# Patient Record
Sex: Female | Born: 1965 | Race: Black or African American | Hispanic: No | Marital: Married | Smoking: Current every day smoker
Health system: Southern US, Community
[De-identification: ages and names within clinical notes are randomized; demographics above are authoritative.]

## PROBLEM LIST (undated history)

## (undated) DIAGNOSIS — E119 Type 2 diabetes mellitus without complications: Secondary | ICD-10-CM

## (undated) DIAGNOSIS — E079 Disorder of thyroid, unspecified: Secondary | ICD-10-CM

## (undated) DIAGNOSIS — I10 Essential (primary) hypertension: Secondary | ICD-10-CM

## (undated) DIAGNOSIS — Z720 Tobacco use: Secondary | ICD-10-CM

## (undated) HISTORY — PX: TUBAL LIGATION: SHX77

---

## 2013-01-13 ENCOUNTER — Emergency Department: Payer: Self-pay | Admitting: Emergency Medicine

## 2014-10-29 IMAGING — CR DG HAND COMPLETE 3+V*L*
1 series · 3 of 3 positions shown · non-contrast
Comparison: none

REASON FOR EXAM: pan/edema/finger joint deformity
COMMENTS:   May transport without cardiac monitor

[Series 1: pa · 0.17mm/px · 3 of 3 slices shown]
[im 1/3]
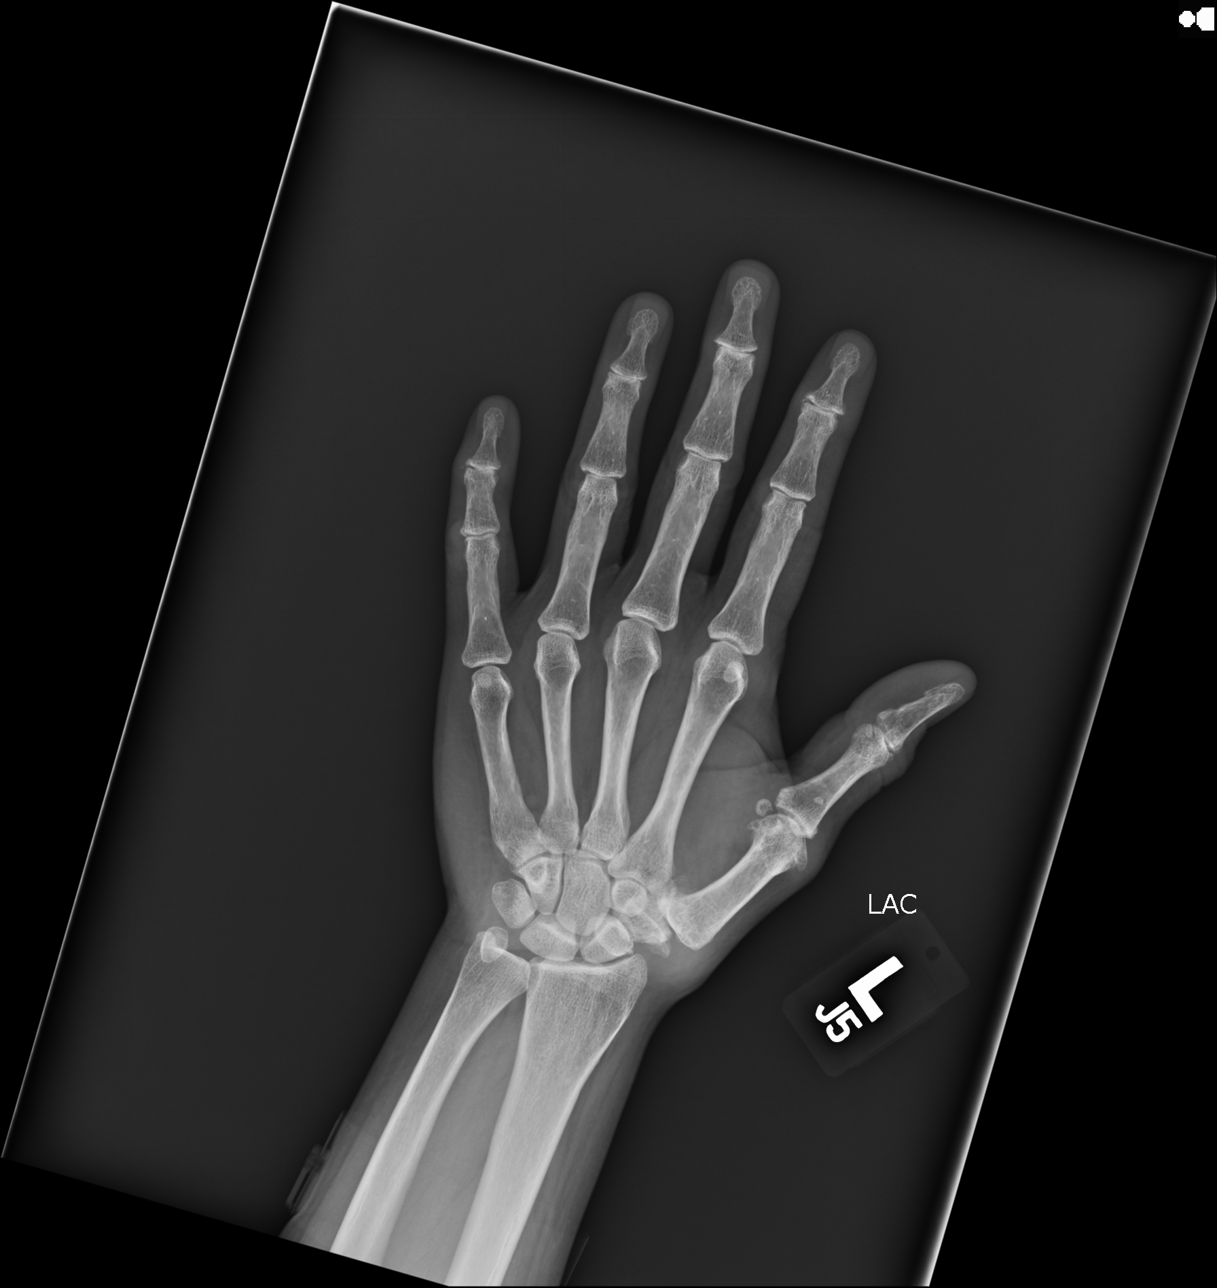
[im 2/3]
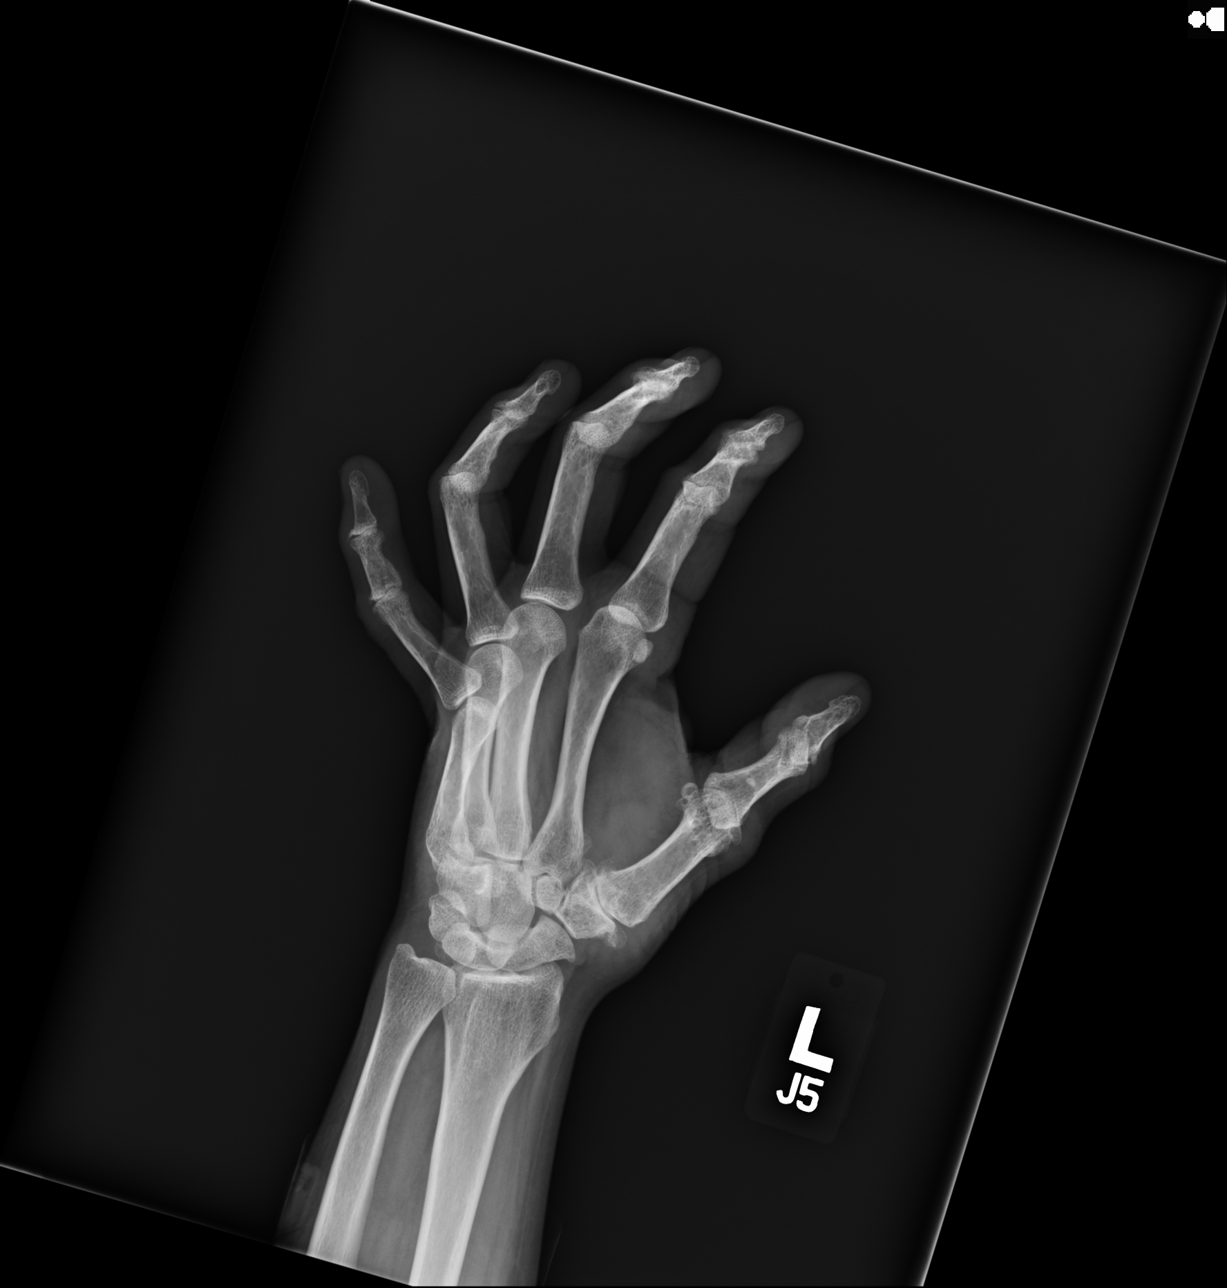
[im 3/3]
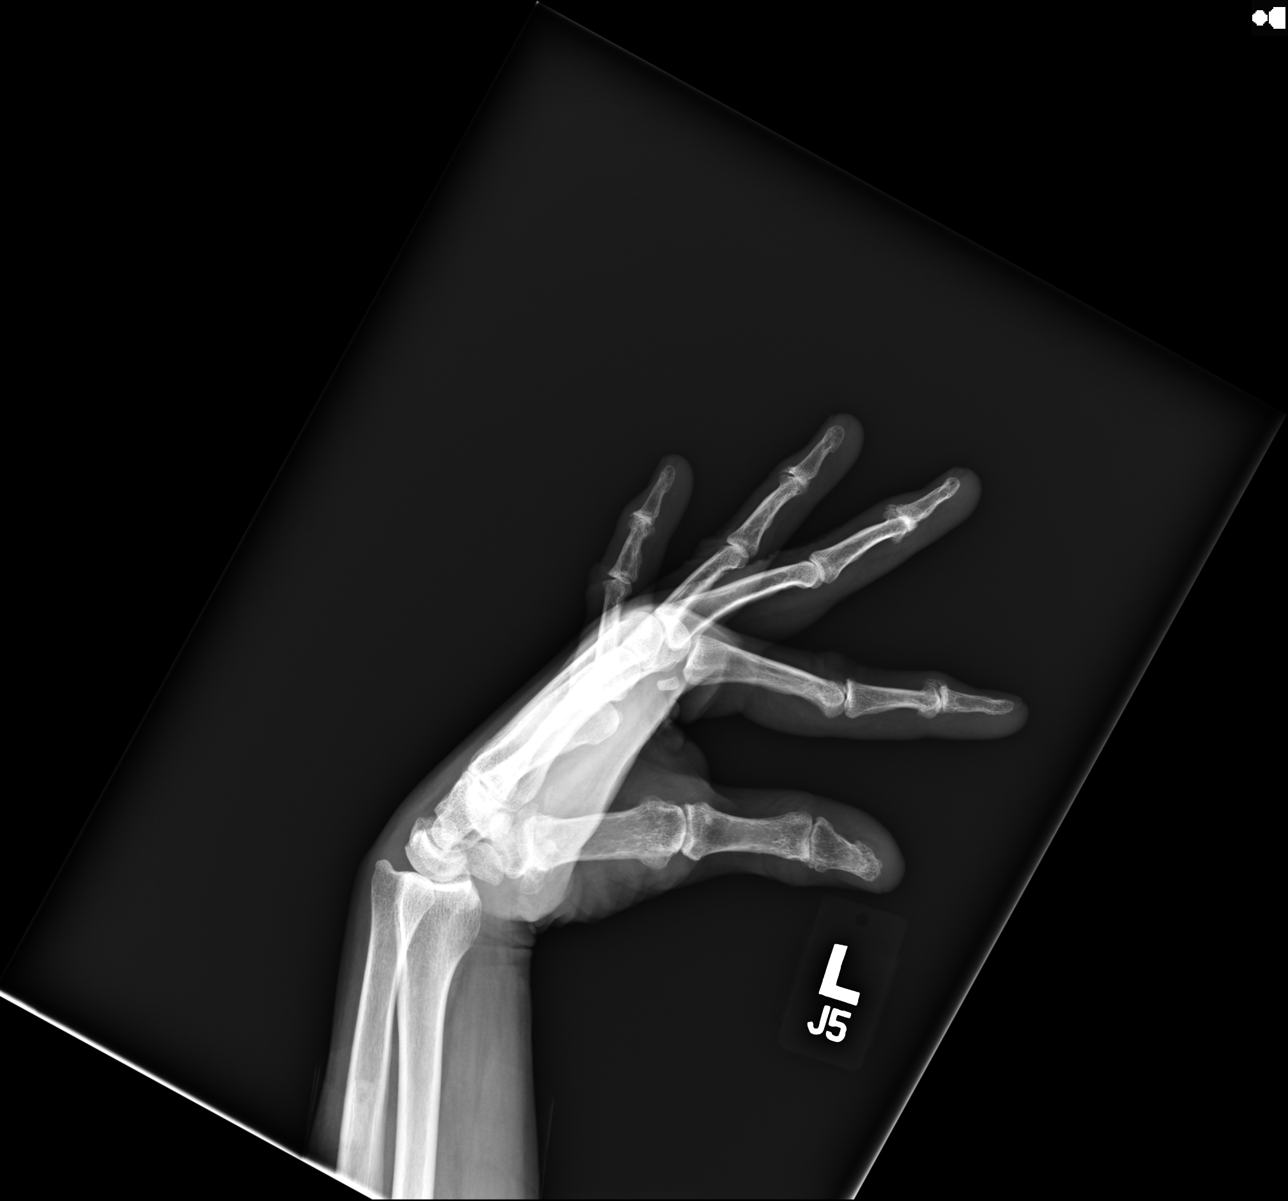

[3 of 3 positions shown; findings below may reference images not displayed]

PROCEDURE:     DXR - DXR HAND LT COMPLETE  W/OBLIQUES  - January 13, 2013 [DATE]

RESULT:     Three views of the left hand reveal narrowing of the
interphalangeal joints and the carpophalangeal joints diffusely. There is no
lytic or blastic lesion nor evidence of a fracture or dislocation. There is
moderate degenerative change of the first carpometacarpal joint. There is
narrowing of the radio carpal joint. No erosive changes are demonstrated. No
abnormal soft tissue calcifications are evident.
IMPRESSION: There is moderate osteoarthritic change diffusely of the
radiocarpal joint, the first carpometacarpal joint, and the
metacarpophalangeal and interphalangeal joints.

[REDACTED]

## 2017-02-07 ENCOUNTER — Emergency Department: Payer: Self-pay

## 2017-02-07 ENCOUNTER — Emergency Department
Admission: EM | Admit: 2017-02-07 | Discharge: 2017-02-07 | Disposition: A | Discharge status: Home / Self Care | Payer: Self-pay | Attending: Emergency Medicine | Admitting: Emergency Medicine

## 2017-02-07 ENCOUNTER — Other Ambulatory Visit: Payer: Self-pay

## 2017-02-07 DIAGNOSIS — F17200 Nicotine dependence, unspecified, uncomplicated: Secondary | ICD-10-CM | POA: Insufficient documentation

## 2017-02-07 DIAGNOSIS — R2243 Localized swelling, mass and lump, lower limb, bilateral: Secondary | ICD-10-CM | POA: Insufficient documentation

## 2017-02-07 DIAGNOSIS — E119 Type 2 diabetes mellitus without complications: Secondary | ICD-10-CM | POA: Insufficient documentation

## 2017-02-07 DIAGNOSIS — R609 Edema, unspecified: Secondary | ICD-10-CM

## 2017-02-07 HISTORY — DX: Disorder of thyroid, unspecified: E07.9

## 2017-02-07 HISTORY — DX: Type 2 diabetes mellitus without complications: E11.9

## 2017-02-07 LAB — TROPONIN I: Troponin I: <0.03 ng/mL (ref <0.03)

## 2017-02-07 LAB — CBC
HCT: 40 % (ref 35.0–47.0)
Hemoglobin: 13.4 g/dL (ref 12.0–16.0)
MCH: 32.9 pg (ref 26.0–34.0)
MCHC: 33.4 g/dL (ref 32.0–36.0)
MCV: 98.5 fL (ref 80.0–100.0)
PLATELETS: 198 10*3/uL (ref 150–440)
RBC: 4.06 MIL/uL (ref 3.80–5.20)
RDW: 13.9 % (ref 11.5–14.5)
WBC: 6.4 10*3/uL (ref 3.6–11.0)

## 2017-02-07 LAB — BASIC METABOLIC PANEL
Anion gap: 9 (ref 5–15)
BUN: 10 mg/dL (ref 6–20)
CHLORIDE: 101 mmol/L (ref 101–111)
CO2: 25 mmol/L (ref 22–32)
CREATININE: 0.65 mg/dL (ref 0.44–1.00)
Calcium: 9 mg/dL (ref 8.9–10.3)
GFR calc Af Amer: >60 mL/min (ref >60)
GFR calc non Af Amer: >60 mL/min (ref >60)
GLUCOSE: 94 mg/dL (ref 65–99)
Potassium: 3.6 mmol/L (ref 3.5–5.1)
Sodium: 135 mmol/L (ref 135–145)

## 2017-02-07 MED ORDER — OXYCODONE-ACETAMINOPHEN 5-325 MG PO TABS
2.0000 | ORAL_TABLET | Freq: Once | ORAL | Status: AC
  Administered 2017-02-07: 2 via ORAL
  Filled 2017-02-07: qty 2

## 2017-02-07 MED ORDER — FUROSEMIDE 20 MG PO TABS: 20.0000 mg | ORAL_TABLET | Freq: Every day | ORAL | 0 refills | Status: DC

## 2017-02-07 NOTE — ED Triage Notes (Signed)
Pt c/o swelling in BL LE for the past couple of weeks, states in the past week she has not been able to work due to the swelling. Pt also states she has a hx of diabetes on insulin years ago but has not taken medication in years.

## 2017-02-07 NOTE — ED Provider Notes (Signed)
[facility] Emergency Department Provider Note       Time seen: ----------------------------------------- 10:30 AM on 02/07/2017 -----------------------------------------    I have reviewed the triage vital signs and the nursing notes.  HISTORY   Chief Complaint Leg Swelling    HPI Ronny BaconBrenda A Schaben is a 51 y.o. female with a history of diabetes who presents to the ED for swelling in the lower extremities for the past couple weeks.  Patient states in the past week she has not been able to work due to the swelling.  Patient reports she has a history of diabetes but is not taking medication at this time.  Patient noticed swelling in the left leg more than the right. Pain is 10 out of 10 in the left leg.  Past Medical History:  Diagnosis Date  . 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 Thyroid disease    Graves disease    There are no active problems to display for this patient.   Past Surgical History:  Procedure Laterality Date  . TUBAL LIGATION      Allergies Patient has no known allergies.  Social History Social History   Tobacco Use  . Smoking status: Current Every Day Smoker  . Smokeless tobacco: Never Used  Substance Use Topics  . Alcohol use: Yes  . Drug use: No    Review of Systems Constitutional: Negative for fever. Cardiovascular: Negative for chest pain. Respiratory: Negative for shortness of breath. Gastrointestinal: Negative for abdominal pain, vomiting and diarrhea. Genitourinary: Negative for dysuria. Musculoskeletal: Positive for edema, pain particularly in the left leg Skin: Negative for rash. Neurological: Negative for headaches, focal weakness or numbness.  All systems negative/normal/unremarkable except as stated in the HPI  ____________________________________________   PHYSICAL EXAM:  VITAL SIGNS: ED Triage Vitals  Enc Vitals Group     BP 02/07/17 0757 (!) 188/91     Pulse  Rate 02/07/17 0757 96     Resp 02/07/17 0757 17     Temp 02/07/17 0757 98.4 F (36.9 C)     Temp Source 02/07/17 0757 Oral     SpO2 02/07/17 0757 99 %     Weight 02/07/17 0758 135 lb (61.2 kg)     Height 02/07/17 0758 5' (1.524 m)     Head Circumference --      Peak Flow --      Pain Score --      Pain Loc --      Pain Edu? --      Excl. in GC? --     Constitutional: Alert and oriented. Well appearing and in no distress. Eyes: Conjunctivae are normal. Normal extraocular movements. ENT   Head: Normocephalic and atraumatic.   Nose: No congestion/rhinnorhea.   Mouth/Throat: Mucous membranes are moist.   Neck: No stridor. Cardiovascular: Normal rate, regular rhythm. No murmurs, rubs, or gallops.  Good peripheral pulses Respiratory: Normal respiratory effort without tachypnea nor retractions. Breath sounds are clear and equal bilaterally. No wheezes/rales/rhonchi. Gastrointestinal: Soft and nontender. Normal bowel sounds Musculoskeletal: Nontender with normal range of motion in extremities. No lower extremity tenderness nor edema. Neurologic:  Normal speech and language. No gross focal neurologic deficits are appreciated.  Skin:  Skin is warm, dry and intact. No rash noted. Psychiatric: Mood and affect are normal. Speech and behavior are normal.  ____________________________________________  EKG: Interpreted by me.  Sinus rhythm rate 84 bpm, normal PR interval, normal QRS, normal QT.  ____________________________________________  ED  COURSE:  Pertinent labs & imaging results that were available during my care of the patient were reviewed by me and considered in my medical decision making (see chart for details). Patient presents for swelling, we will assess with labs and imaging as indicated.   Procedures ____________________________________________   LABS (pertinent positives/negatives)  Labs Reviewed  BASIC METABOLIC PANEL  CBC  TROPONIN I     RADIOLOGY Images were viewed by me  Sinus rhythm with a rate 84 bpm, normal PR interval, normal QRS, normal QT.  ____________________________________________  DIFFERENTIAL DIAGNOSIS   Peripheral edema, CHF, DVT, albumin deficiency  FINAL ASSESSMENT AND PLAN  Edema  Plan: Patient had presented for peripheral edema and pain secondary to same. Patient's labs were reassuring. Patient's imaging was negative for DVT.  I will advise compression stockings as well as short supply of Lasix.  She is stable for outpatient follow-up.   Emily FilbertWilliams, Jonathan E, MD   Note: This note was generated in part or whole with voice recognition software. Voice recognition is usually quite accurate but there are transcription errors that can and very often do occur. I apologize for any typographical errors that were not detected and corrected.     Emily FilbertWilliams, Jonathan E, MD 02/07/17 1146

## 2017-10-03 ENCOUNTER — Ambulatory Visit: Payer: Self-pay | Attending: Oncology

## 2017-11-15 IMAGING — US US EXTREM LOW VENOUS*L*
1 series · 13 of 24 positions shown · non-contrast
Comparison: None.

CLINICAL DATA: Left lower extremity pain and edema for the past 6
days. History of smoking. Evaluate for DVT.



[Series 1: us extrem low venous*left* · 13 of 35 slices shown]
[im 1/35]
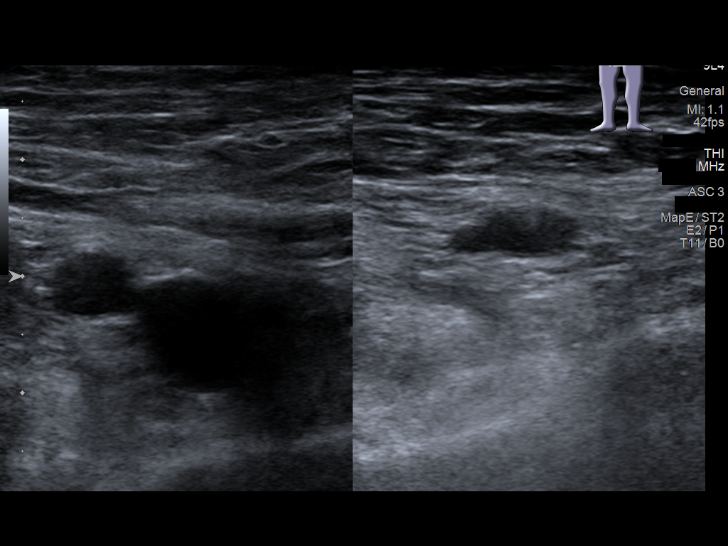
[im 3/35]
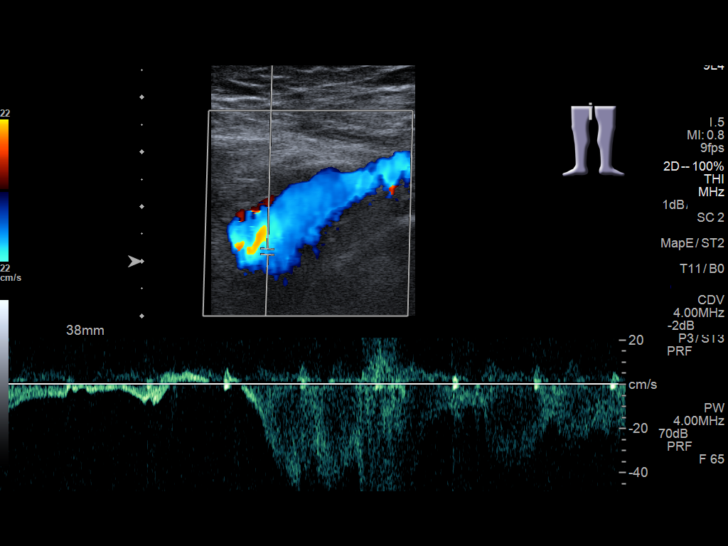
[im 6/35]
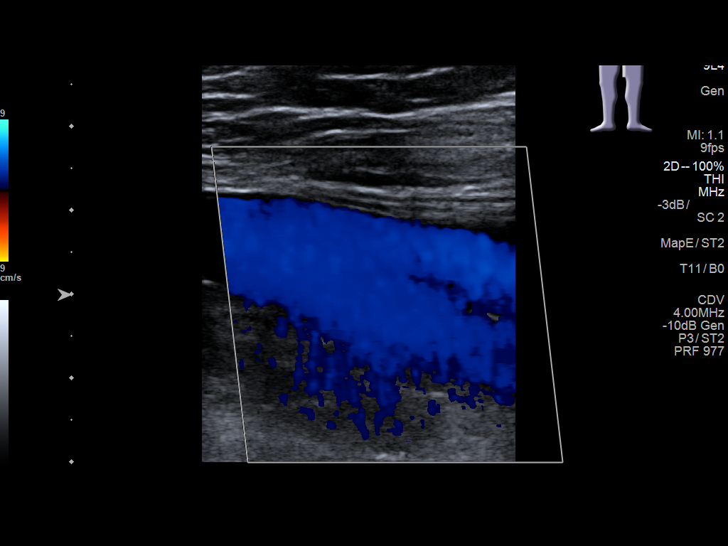
[im 9/35]
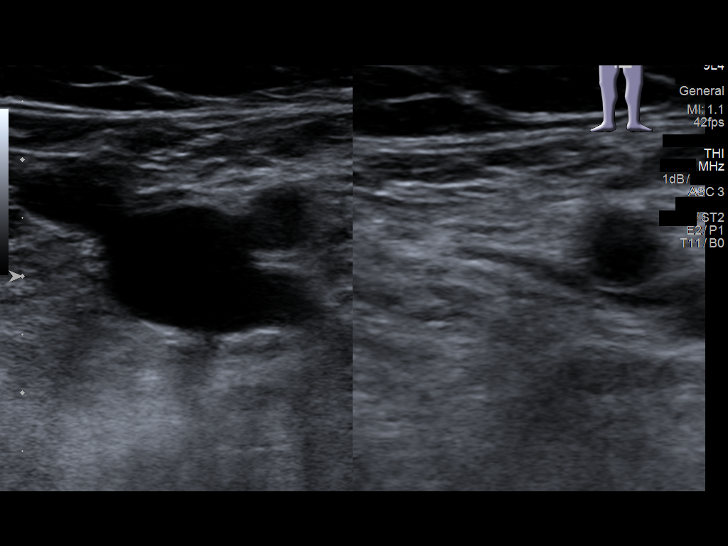
[im 12/35]
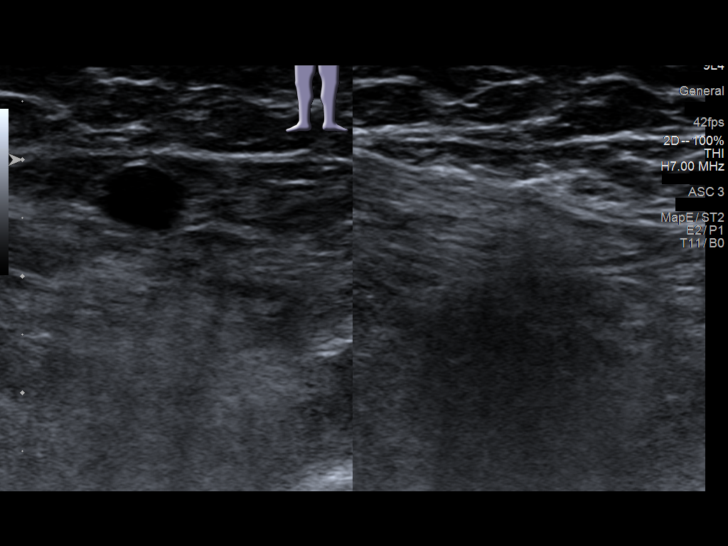
[im 15/35]
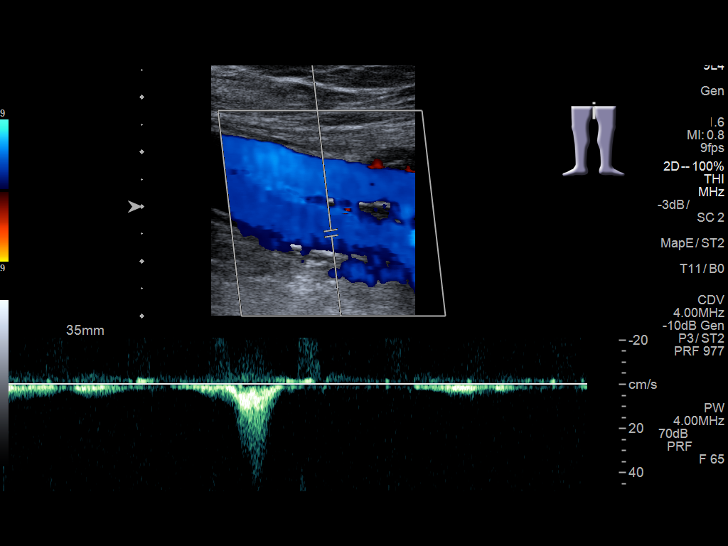
[im 18/35]
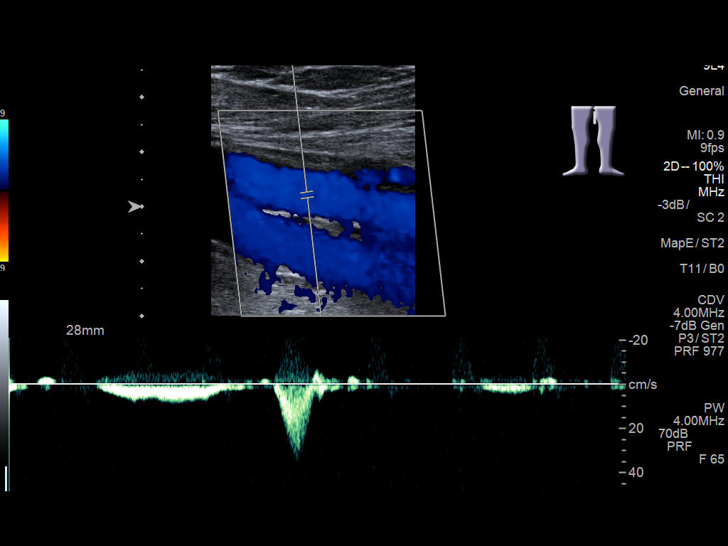
[im 20/35]
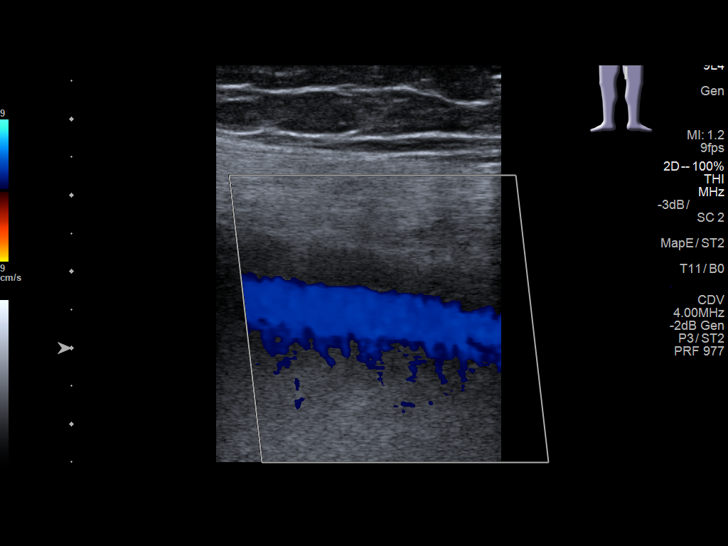
[im 23/35]
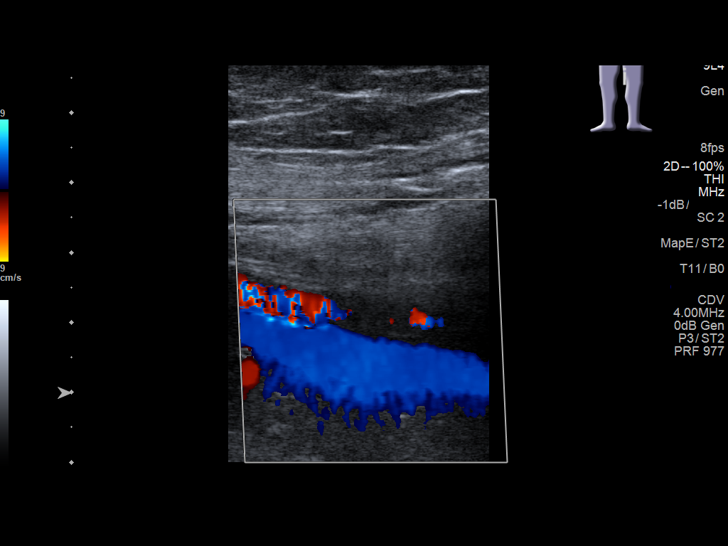
[im 26/35]
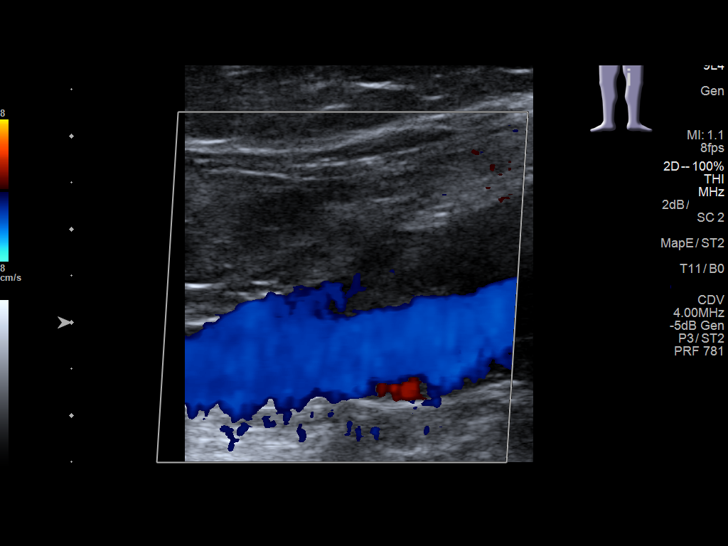
[im 29/35]
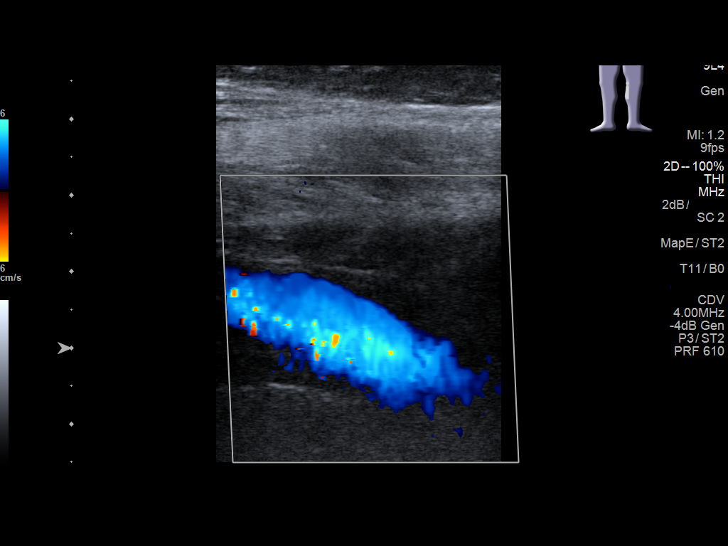
[im 32/35]
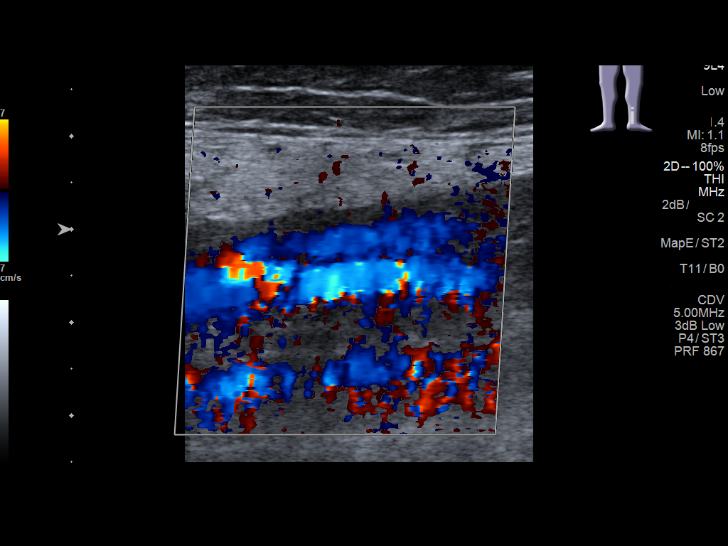
[im 35/35]
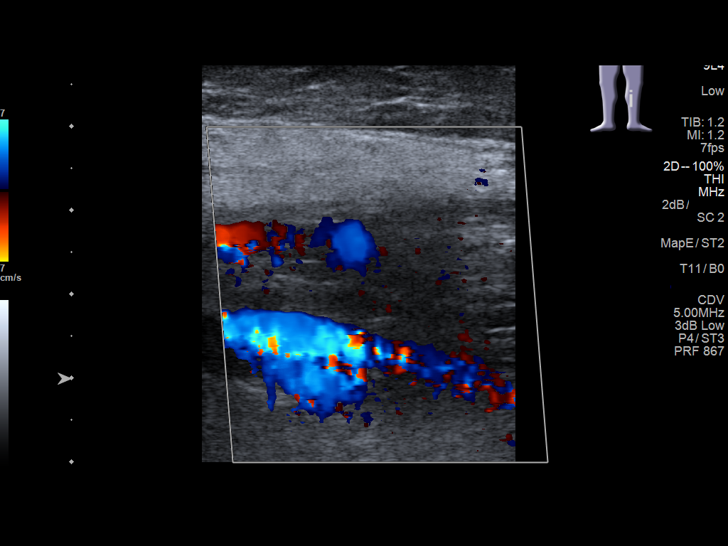

[13 of 24 positions shown; findings below may reference images not displayed]

FINDINGS: Contralateral Common Femoral Vein: Respiratory phasicity is normal
and symmetric with the symptomatic side. No evidence of thrombus.
Normal compressibility.

Common Femoral Vein: No evidence of thrombus. Normal
compressibility, respiratory phasicity and response to augmentation.

Saphenofemoral Junction: No evidence of thrombus. Normal
compressibility and flow on color Doppler imaging.

Profunda Femoral Vein: No evidence of thrombus. Normal
compressibility and flow on color Doppler imaging.

Femoral Vein: No evidence of thrombus. Normal compressibility,
respiratory phasicity and response to augmentation.

Popliteal Vein: No evidence of thrombus. Normal compressibility,
respiratory phasicity and response to augmentation.

Calf Veins: No evidence of thrombus. Normal compressibility and flow
on color Doppler imaging.

Superficial Great Saphenous Vein: No evidence of thrombus. Normal
compressibility.

Venous Reflux:  None.

Other Findings:  None.
IMPRESSION: No evidence DVT within the left lower extremity.

## 2018-11-23 IMAGING — CR DG CHEST 2V
2 series · 2 of 2 positions shown · non-contrast
Comparison: None.

CLINICAL DATA: Bilateral lower extremity swelling with shortness of
Breath

EXAM:
CHEST  2 VIEW

[chest lat]
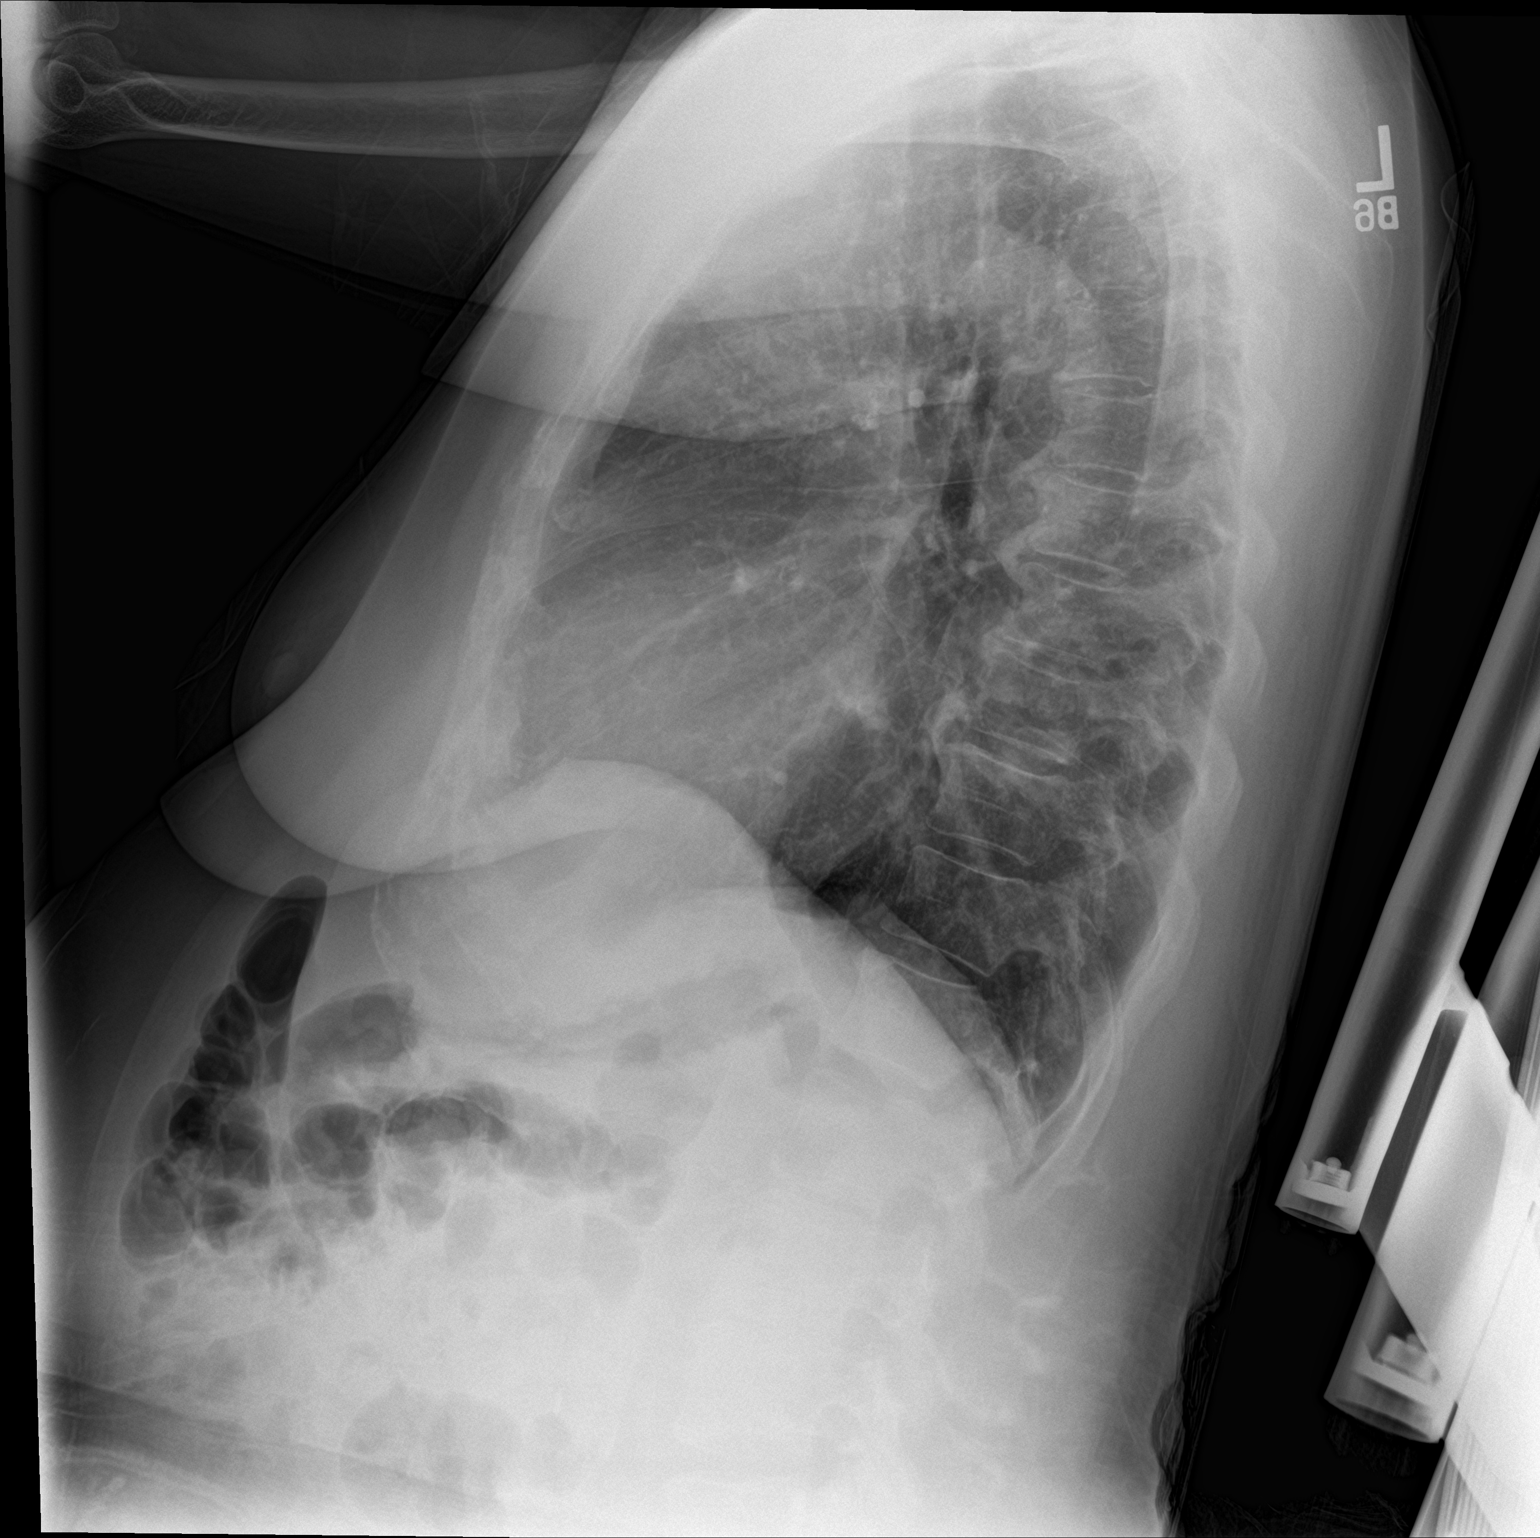

[chest ap]
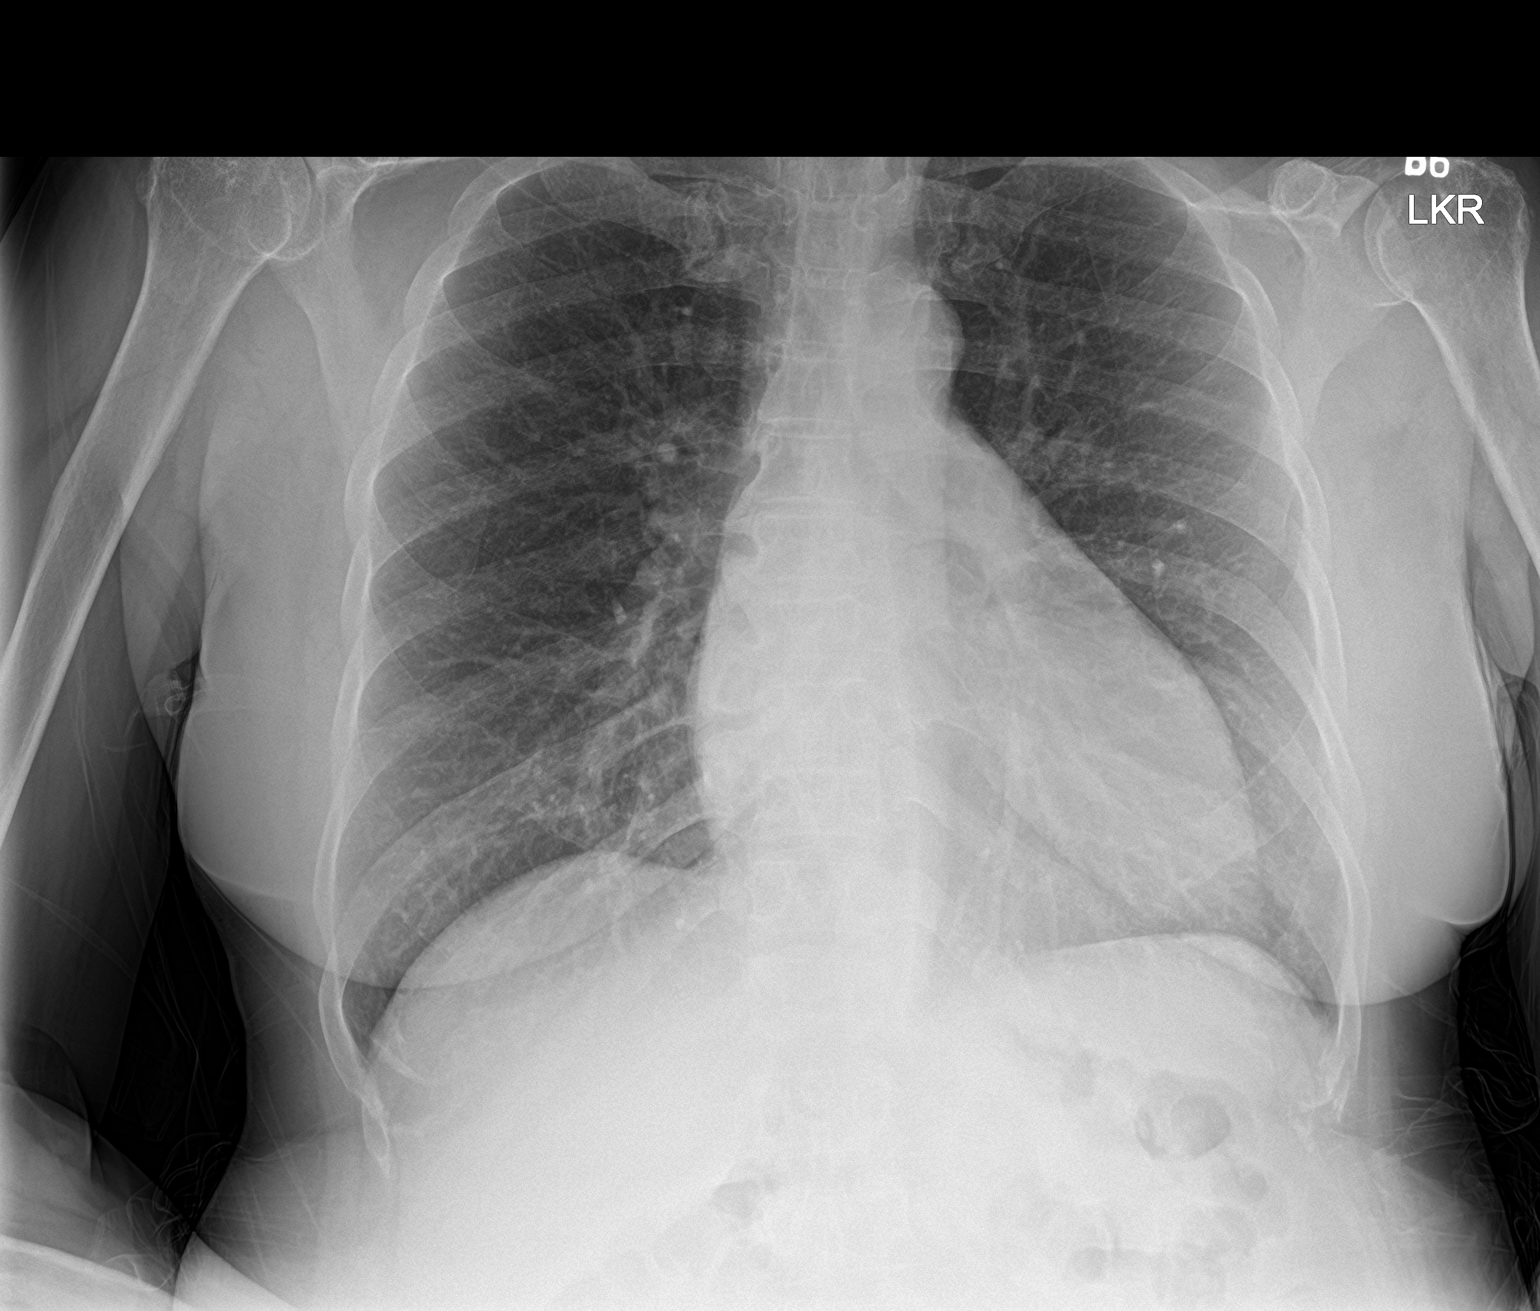

[2 of 2 positions shown; findings below may reference images not displayed]

FINDINGS: Cardiac shadow is at the upper limits of normal in size. The lungs
are well aerated bilaterally. No focal infiltrate or sizable
effusion is seen. Degenerative change of the thoracic spine is
noted.
IMPRESSION: No active cardiopulmonary disease.

## 2019-02-27 ENCOUNTER — Emergency Department
Admission: EM | Admit: 2019-02-27 | Discharge: 2019-02-27 | Disposition: A | Discharge status: Home / Self Care | Payer: Self-pay | Attending: Emergency Medicine | Admitting: Emergency Medicine

## 2019-02-27 ENCOUNTER — Encounter: Payer: Self-pay | Admitting: Emergency Medicine

## 2019-02-27 ENCOUNTER — Other Ambulatory Visit: Payer: Self-pay

## 2019-02-27 ENCOUNTER — Emergency Department: Payer: Self-pay

## 2019-02-27 DIAGNOSIS — R600 Localized edema: Secondary | ICD-10-CM | POA: Insufficient documentation

## 2019-02-27 DIAGNOSIS — F17200 Nicotine dependence, unspecified, uncomplicated: Secondary | ICD-10-CM | POA: Insufficient documentation

## 2019-02-27 DIAGNOSIS — R609 Edema, unspecified: Secondary | ICD-10-CM

## 2019-02-27 DIAGNOSIS — E119 Type 2 diabetes mellitus without complications: Secondary | ICD-10-CM | POA: Insufficient documentation

## 2019-02-27 LAB — BASIC METABOLIC PANEL
Anion gap: 12 (ref 5–15)
BUN: 13 mg/dL (ref 6–20)
CO2: 23 mmol/L (ref 22–32)
Calcium: 9.3 mg/dL (ref 8.9–10.3)
Chloride: 98 mmol/L (ref 98–111)
Creatinine, Ser: 0.63 mg/dL (ref 0.44–1.00)
GFR calc Af Amer: >60 mL/min (ref >60)
GFR calc non Af Amer: >60 mL/min (ref >60)
Glucose, Bld: 75 mg/dL (ref 70–99)
Potassium: 4.2 mmol/L (ref 3.5–5.1)
Sodium: 133 mmol/L — ABNORMAL LOW (ref 135–145)

## 2019-02-27 LAB — CBC
HCT: 42 % (ref 36.0–46.0)
Hemoglobin: 14.2 g/dL (ref 12.0–15.0)
MCH: 32.9 pg (ref 26.0–34.0)
MCHC: 33.8 g/dL (ref 30.0–36.0)
MCV: 97.4 fL (ref 80.0–100.0)
Platelets: 208 10*3/uL (ref 150–400)
RBC: 4.31 MIL/uL (ref 3.87–5.11)
RDW: 13.4 % (ref 11.5–15.5)
WBC: 5.3 10*3/uL (ref 4.0–10.5)
nRBC: 0 % (ref 0.0–0.2)

## 2019-02-27 LAB — TROPONIN I (HIGH SENSITIVITY): Troponin I (High Sensitivity): 6 ng/L (ref <18)

## 2019-02-27 LAB — BRAIN NATRIURETIC PEPTIDE: B Natriuretic Peptide: 87 pg/mL (ref 0.0–100.0)

## 2019-02-27 MED ORDER — FUROSEMIDE 20 MG PO TABS: 20.0000 mg | ORAL_TABLET | Freq: Every day | ORAL | 0 refills | Status: DC

## 2019-02-27 NOTE — ED Notes (Signed)
Pt reports bilateral ankle swelling for over a year.  States is worse in last several days causing pain preventing her from walking well.  Pt states was seen here and at [facility] for same approx 1 year ago.
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
## 2019-02-27 NOTE — ED Triage Notes (Signed)
Here for BLE edema. Hx of same last year per pt.  Denies SHOB but c/o chest pain when laying flat.  Unlabored, VSS at this time.  No fevers.  Swelling worse than normal per pt.

## 2019-02-27 NOTE — Discharge Instructions (Signed)
There was no evidence of blood clot or other dangerous process going on in your left leg.  You may take a fluid pill once a day for the next 5 days to help reduce swelling.  Please also keep the leg elevated when you are sitting still and use a compression stocking during the day.  Please schedule a follow-up appointment with your primary care doctor and return to the ER for worsening symptoms.

## 2020-12-12 IMAGING — US US EXTREM LOW VENOUS*L*
1 series · 13 of 24 positions shown · non-contrast
Comparison: None.

CLINICAL DATA: 53-year-old with left lower extremity pain and
swelling.



[Series 1: us extrem low venous*left* · 13 of 36 slices shown]
[im 1/36]
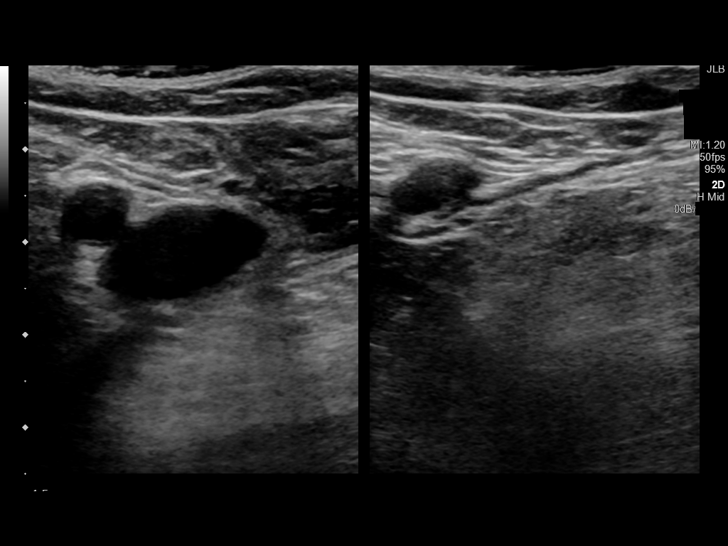
[im 4/36]
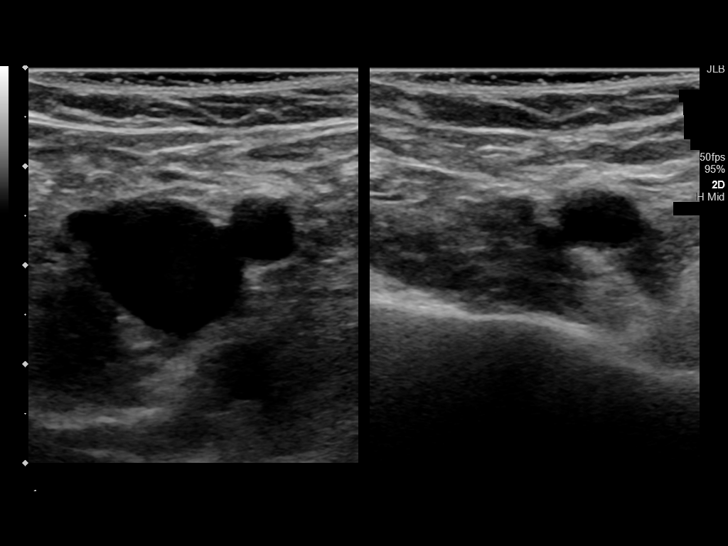
[im 7/36]
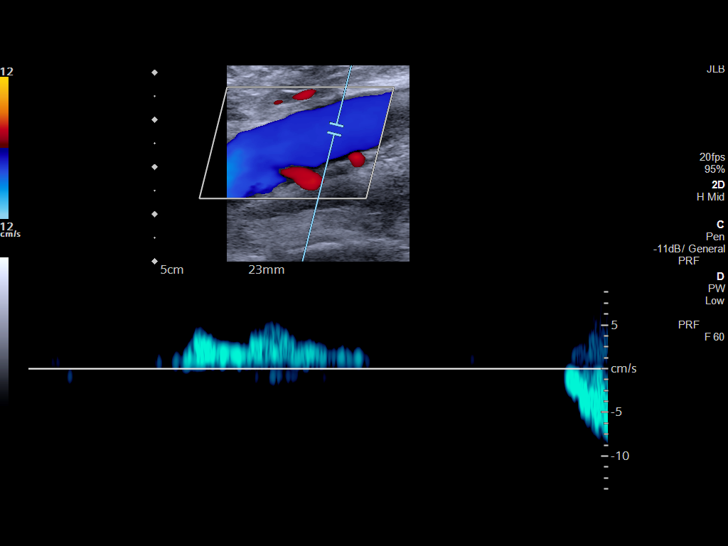
[im 10/36]
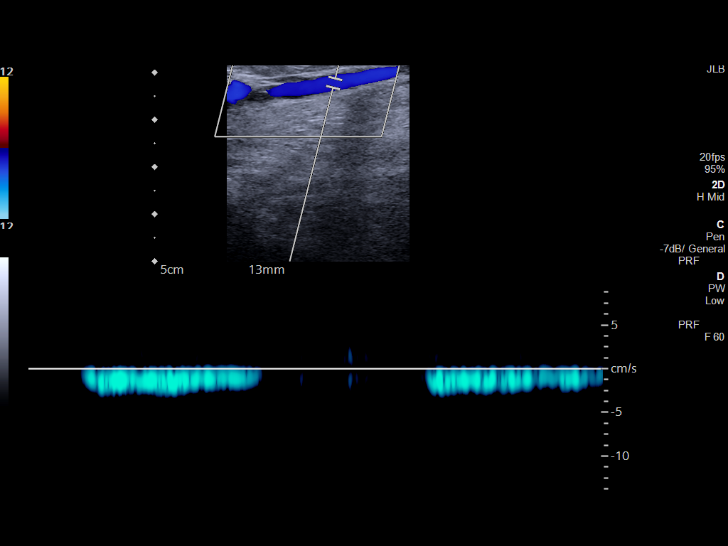
[im 13/36]
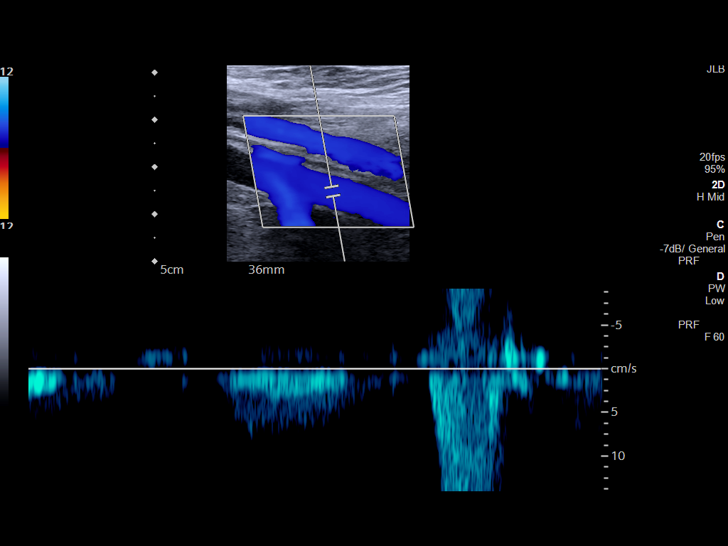
[im 16/36]
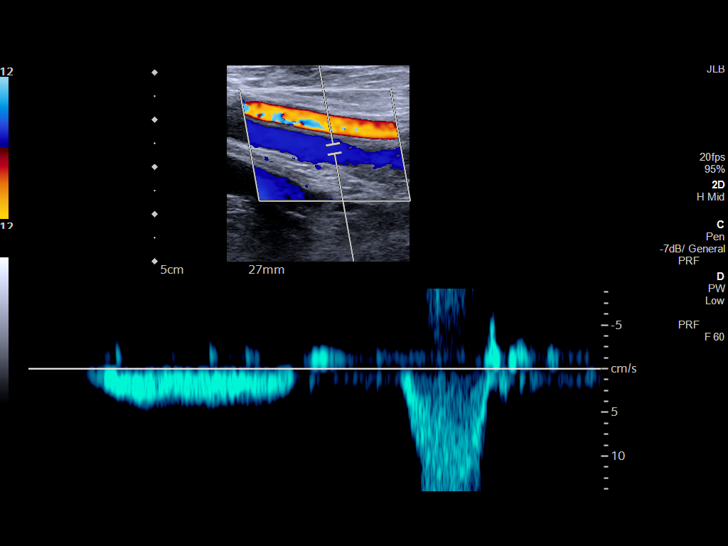
[im 19/36]
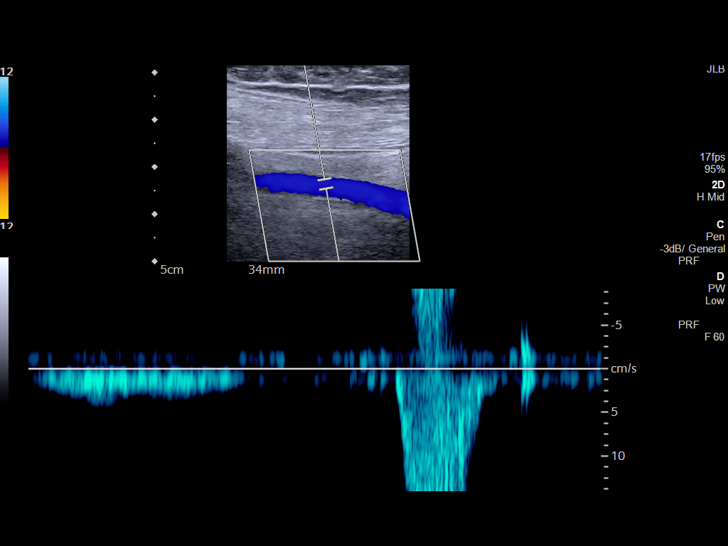
[im 20/36]
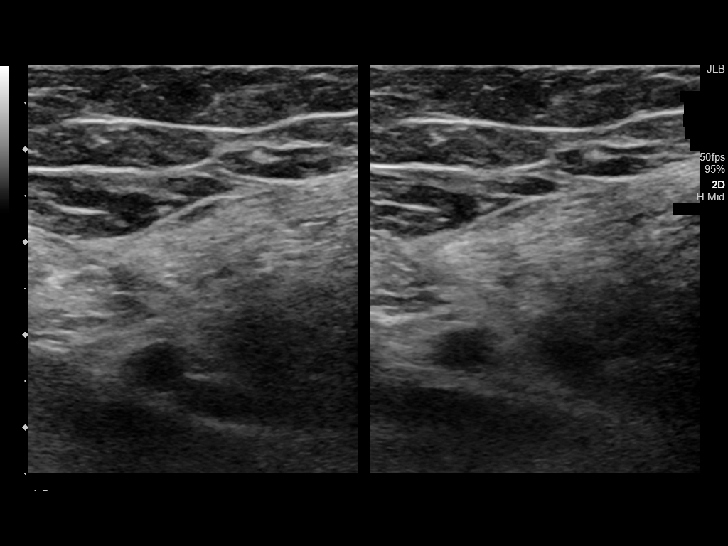
[im 23/36]
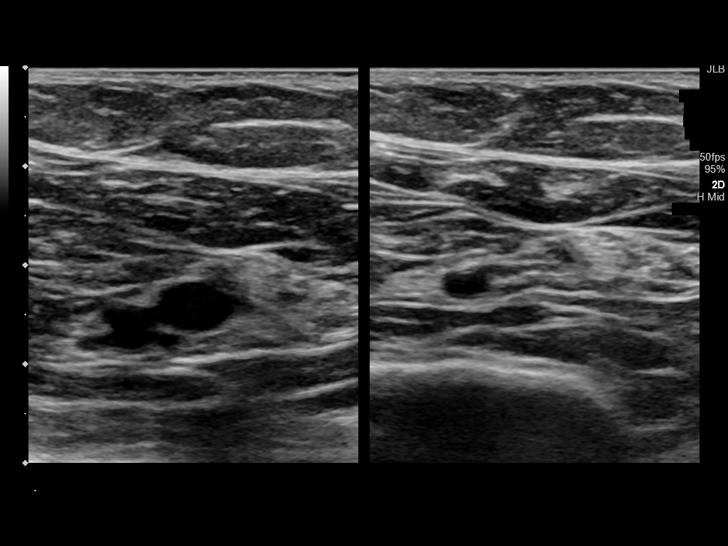
[im 26/36]
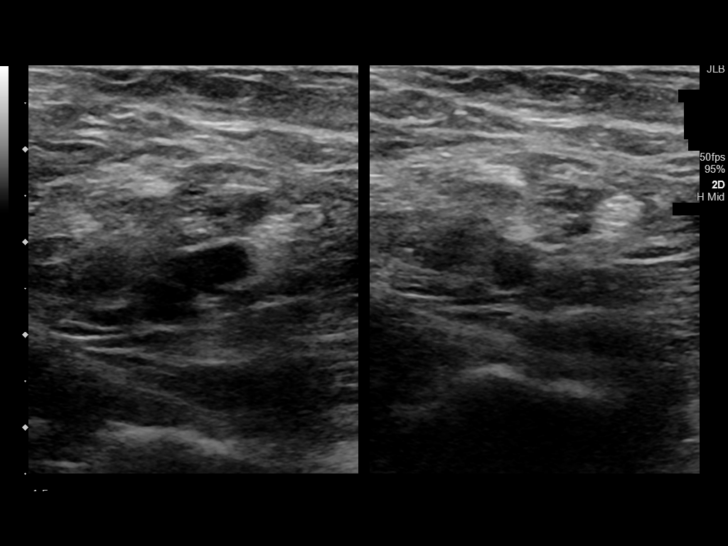
[im 29/36]
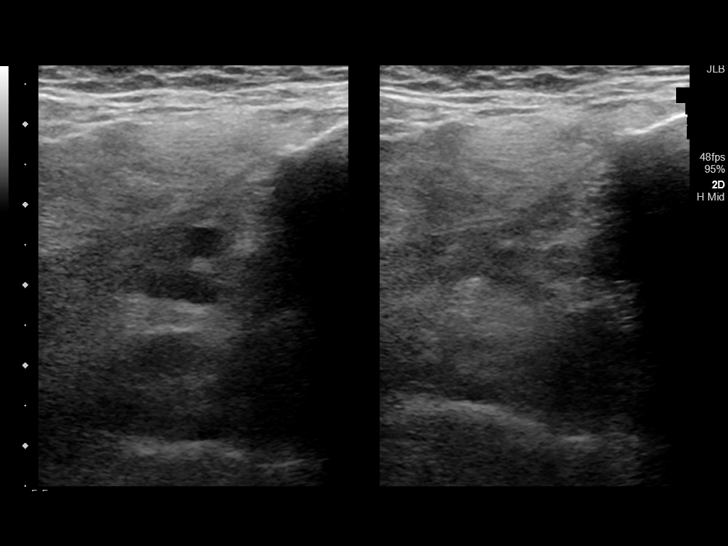
[im 32/36]
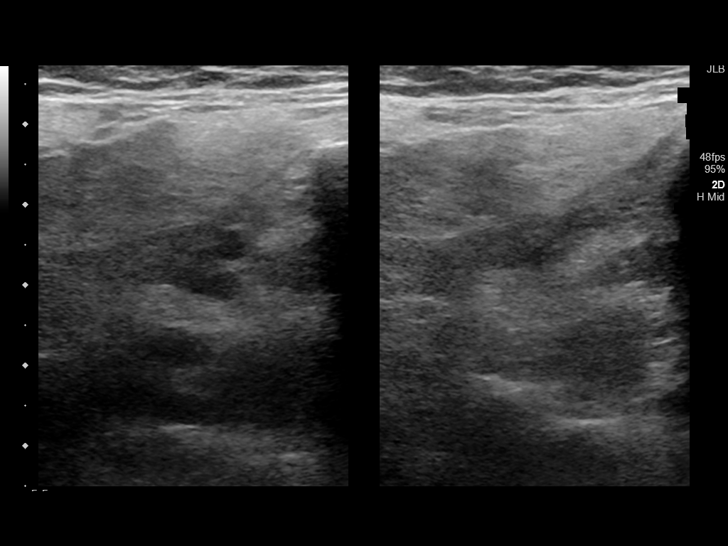
[im 36/36]
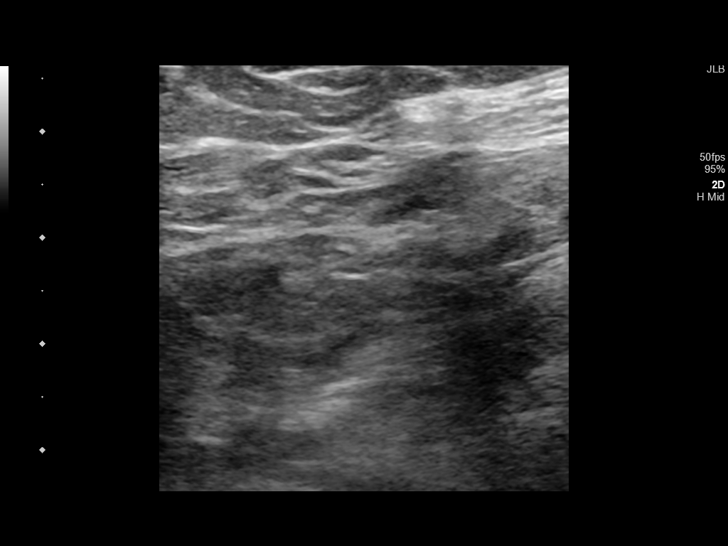

[13 of 24 positions shown; findings below may reference images not displayed]

FINDINGS: Contralateral Common Femoral Vein: Respiratory phasicity is normal
and symmetric with the symptomatic side. No evidence of thrombus.
Normal compressibility.

Common Femoral Vein: No evidence of thrombus. Normal
compressibility, respiratory phasicity and response to augmentation.

Saphenofemoral Junction: No evidence of thrombus. Normal
compressibility and flow on color Doppler imaging.

Profunda Femoral Vein: No evidence of thrombus. Normal
compressibility and flow on color Doppler imaging.

Femoral Vein: No evidence of thrombus. Normal compressibility,
respiratory phasicity and response to augmentation.

Popliteal Vein: No evidence of thrombus. Normal compressibility,
respiratory phasicity and response to augmentation.

Calf Veins: No evidence of thrombus. Normal compressibility and flow
on color Doppler imaging.

Superficial Great Saphenous Vein: No evidence of thrombus. Normal
compressibility.

Other Findings:  None.
IMPRESSION: Negative for deep venous thrombosis in left lower extremity.

## 2021-10-19 ENCOUNTER — Other Ambulatory Visit: Payer: Self-pay | Admitting: Physician Assistant

## 2021-10-19 DIAGNOSIS — Z1231 Encounter for screening mammogram for malignant neoplasm of breast: Secondary | ICD-10-CM

## 2021-11-15 ENCOUNTER — Ambulatory Visit
Admission: RE | Admit: 2021-11-15 | Discharge: 2021-11-15 | Disposition: A | Discharge status: Home / Self Care | Payer: Medicare Other | Source: Ambulatory Visit | Attending: Physician Assistant | Admitting: Physician Assistant

## 2021-11-15 DIAGNOSIS — Z1231 Encounter for screening mammogram for malignant neoplasm of breast: Secondary | ICD-10-CM | POA: Diagnosis present

## 2022-11-03 ENCOUNTER — Other Ambulatory Visit: Payer: Self-pay

## 2022-11-03 ENCOUNTER — Emergency Department
Admission: EM | Admit: 2022-11-03 | Discharge: 2022-11-03 | Disposition: A | Discharge status: Home / Self Care | Payer: Medicare HMO | Attending: Emergency Medicine | Admitting: Emergency Medicine

## 2022-11-03 ENCOUNTER — Emergency Department: Payer: Medicare HMO

## 2022-11-03 DIAGNOSIS — I509 Heart failure, unspecified: Secondary | ICD-10-CM | POA: Diagnosis not present

## 2022-11-03 DIAGNOSIS — R059 Cough, unspecified: Secondary | ICD-10-CM | POA: Diagnosis present

## 2022-11-03 DIAGNOSIS — U071 COVID-19: Secondary | ICD-10-CM | POA: Diagnosis not present

## 2022-11-03 DIAGNOSIS — F17200 Nicotine dependence, unspecified, uncomplicated: Secondary | ICD-10-CM | POA: Diagnosis not present

## 2022-11-03 DIAGNOSIS — J449 Chronic obstructive pulmonary disease, unspecified: Secondary | ICD-10-CM | POA: Insufficient documentation

## 2022-11-03 DIAGNOSIS — H6691 Otitis media, unspecified, right ear: Secondary | ICD-10-CM | POA: Insufficient documentation

## 2022-11-03 DIAGNOSIS — H6690 Otitis media, unspecified, unspecified ear: Secondary | ICD-10-CM

## 2022-11-03 LAB — SARS CORONAVIRUS 2 BY RT PCR: SARS Coronavirus 2 by RT PCR: POSITIVE — AB

## 2022-11-03 MED ORDER — IPRATROPIUM-ALBUTEROL 0.5-2.5 (3) MG/3ML IN SOLN
3.0000 mL | Freq: Once | RESPIRATORY_TRACT | Status: AC
  Administered 2022-11-03: 3 mL via RESPIRATORY_TRACT
  Filled 2022-11-03: qty 3

## 2022-11-03 MED ORDER — AMOXICILLIN-POT CLAVULANATE 875-125 MG PO TABS: 1.0000 | ORAL_TABLET | Freq: Two times a day (BID) | ORAL | 0 refills | Status: AC

## 2022-11-03 NOTE — ED Triage Notes (Signed)
Pt to ED for cough x2 weeks. Reports hard to sleep because she is up coughing all night  Reports rib pain when coughing

## 2022-11-03 NOTE — ED Provider Notes (Signed)
[facility] Provider Note    Event Date/Time   First MD Initiated Contact with Patient 11/03/22 1002     (approximate)   History   Cough   HPI  Monique Solis is a 57 y.o. female with a past medical history of CHF, COPD, currently actively smoking who presents today for evaluation of cough for the past 2 weeks.  She reports that she is fully up-to-date on all of her COVID shots.  She also reports that she has had right ear pain for the past couple of days.  No fevers or chills.  No chest pain or shortness of breath.  No lower extremity swelling.  She has not had any recent weight gain.  She has been compliant with her diuretics.  She denies orthopnea or dyspnea on exertion.  There are no problems to display for this patient.         Physical Exam   Triage Vital Signs: ED Triage Vitals  Encounter Vitals Group     BP 11/03/22 0925 93/81     Systolic BP Percentile --      Diastolic BP Percentile --      Pulse Rate 11/03/22 0925 77     Resp 11/03/22 0925 20     Temp 11/03/22 0923 99.4 F (37.4 C)     Temp src --      SpO2 11/03/22 0925 100 %     Weight 11/03/22 0924 140 lb (63.5 kg)     Height 11/03/22 0924 5' (1.524 m)     Head Circumference --      Peak Flow --      Pain Score 11/03/22 0923 0     Pain Loc --      Pain Education --      Exclude from Growth Chart --     Most recent vital signs: Vitals:   11/03/22 0925 11/03/22 1207  BP: 93/81 100/78  Pulse: 77 70  Resp: 20 18  Temp:  98.9 F (37.2 C)  SpO2: 100% 100%    Physical Exam Vitals and nursing note reviewed.  Constitutional:      General: Awake and alert. No acute distress.    Appearance: Normal appearance. The patient is normal weight.  HENT:     Head: Normocephalic and atraumatic.     Mouth: Mucous membranes are moist.  Left TM normal, canal clear Right TM with purulent effusion, clear canal.  No mastoid tenderness or erythema.  No proptosis of pinna. Eyes:      General: PERRL. Normal EOMs        Right eye: No discharge.        Left eye: No discharge.     Conjunctiva/sclera: Conjunctivae normal.  Cardiovascular:     Rate and Rhythm: Normal rate and regular rhythm.     Pulses: Normal pulses.  No JVD Pulmonary:     Effort: Pulmonary effort is normal. No respiratory distress.  No accessory muscle use.  Able to speak easily in complete sentences    Breath sounds: Faint expiratory wheezes bilaterally Abdominal:     Abdomen is soft. There is no abdominal tenderness. No rebound or guarding. No distention. Musculoskeletal:        General: No swelling. Normal range of motion.     Cervical back: Normal range of motion and neck supple.  No lower extremity edema Skin:    General: Skin is warm and dry.     Capillary Refill: Capillary refill  takes less than 2 seconds.     Findings: No rash.  Neurological:     Mental Status: The patient is awake and alert.      ED Results / Procedures / Treatments   Labs (all labs ordered are listed, but only abnormal results are displayed) Labs Reviewed  SARS CORONAVIRUS 2 BY RT PCR - Abnormal; Notable for the following components:      Result Value   SARS Coronavirus 2 by RT PCR POSITIVE (*)    All other components within normal limits     EKG     RADIOLOGY I independently reviewed and interpreted imaging and agree with radiologists findings.     PROCEDURES:  Critical Care performed:   Procedures   MEDICATIONS ORDERED IN ED: Medications  ipratropium-albuterol (DUONEB) 0.5-2.5 (3) MG/3ML nebulizer solution 3 mL (3 mLs Nebulization Given 11/03/22 1016)     IMPRESSION / MDM / ASSESSMENT AND PLAN / ED COURSE  I reviewed the triage vital signs and the nursing notes.   Differential diagnosis includes, but is not limited to, pneumonia, COVID-19, heart failure exacerbation, COPD exacerbation, bronchitis, otitis media, otitis externa.  Patient is awake and alert, hemodynamically stable and  afebrile.  She has normal oxygen saturation of 100% on room air.  She demonstrates no increased work of breathing.  She is able to speak easily in complete sentences.  She has no lower extremity edema, no orthopnea, no new weight gain, no dyspnea on exertion, no Rales, no JVD, I do not suspect heart failure exacerbation.  She did have faint wheezes bilaterally, and was treated with a DuoNeb with significant improvement of symptoms.  X-ray obtained is negative for any acute cardiopulmonary abnormality.  Her COVID test is positive.  Her symptoms have been ongoing for the past 2 weeks, therefore she does not meet criteria for Paxlovid.    She did endorse right-sided ear pain that has been new over the last couple of days.  It does appear that she has an otitis media.  No mastoid tenderness or erythema.  Clear canal.  She was started on antibiotics.  We discussed symptomatic management and return precautions.  I also recommended close outpatient follow-up.  She has a normal oxygen saturation of 100% on room air.  I do not feel that she requires admission at this time.  Patient understands and agrees with plan.  She was discharged in stable condition.  Patient's presentation is most consistent with acute complicated illness / injury requiring diagnostic workup.    FINAL CLINICAL IMPRESSION(S) / ED DIAGNOSES   Final diagnoses:  COVID-19  Acute otitis media, unspecified otitis media type     Rx / DC Orders   ED Discharge Orders          Ordered    amoxicillin-clavulanate (AUGMENTIN) 875-125 MG tablet  2 times daily        11/03/22 1156             Note:  This document was prepared using Dragon voice recognition software and may include unintentional dictation errors.   Jackelyn Hoehn, PA-C 11/03/22 1304    Corena Herter, MD 11/03/22 1606

## 2022-11-03 NOTE — Discharge Instructions (Signed)
Your COVID test was positive.  Your chest x-ray is normal.  You were found to have an ear infection as well, you may take the antibiotic for this.  Remain in isolation is much as possible over the next week unless you require medical attention as you are contagious to others.  Please return for any new, worsening, or change in symptoms or other concerns.

## 2022-11-13 ENCOUNTER — Emergency Department: Payer: Medicare HMO

## 2022-11-13 ENCOUNTER — Inpatient Hospital Stay: Payer: Medicare HMO

## 2022-11-13 ENCOUNTER — Other Ambulatory Visit: Payer: Self-pay

## 2022-11-13 ENCOUNTER — Inpatient Hospital Stay
Admission: EM | Admit: 2022-11-13 | Discharge: 2022-11-16 | DRG: 640 | Disposition: A | Discharge status: Home / Self Care | Payer: Medicare HMO | Attending: Osteopathic Medicine | Admitting: Osteopathic Medicine

## 2022-11-13 DIAGNOSIS — N390 Urinary tract infection, site not specified: Secondary | ICD-10-CM | POA: Diagnosis present

## 2022-11-13 DIAGNOSIS — R4701 Aphasia: Secondary | ICD-10-CM | POA: Insufficient documentation

## 2022-11-13 DIAGNOSIS — N179 Acute kidney failure, unspecified: Secondary | ICD-10-CM | POA: Diagnosis present

## 2022-11-13 DIAGNOSIS — E875 Hyperkalemia: Principal | ICD-10-CM | POA: Diagnosis present

## 2022-11-13 DIAGNOSIS — E039 Hypothyroidism, unspecified: Secondary | ICD-10-CM | POA: Insufficient documentation

## 2022-11-13 DIAGNOSIS — F17210 Nicotine dependence, cigarettes, uncomplicated: Secondary | ICD-10-CM | POA: Diagnosis present

## 2022-11-13 DIAGNOSIS — E119 Type 2 diabetes mellitus without complications: Secondary | ICD-10-CM | POA: Diagnosis present

## 2022-11-13 DIAGNOSIS — I10 Essential (primary) hypertension: Secondary | ICD-10-CM | POA: Diagnosis present

## 2022-11-13 DIAGNOSIS — E878 Other disorders of electrolyte and fluid balance, not elsewhere classified: Secondary | ICD-10-CM | POA: Diagnosis present

## 2022-11-13 DIAGNOSIS — Z791 Long term (current) use of non-steroidal anti-inflammatories (NSAID): Secondary | ICD-10-CM

## 2022-11-13 DIAGNOSIS — Z79899 Other long term (current) drug therapy: Secondary | ICD-10-CM | POA: Diagnosis not present

## 2022-11-13 DIAGNOSIS — R791 Abnormal coagulation profile: Secondary | ICD-10-CM | POA: Insufficient documentation

## 2022-11-13 DIAGNOSIS — K5730 Diverticulosis of large intestine without perforation or abscess without bleeding: Secondary | ICD-10-CM | POA: Insufficient documentation

## 2022-11-13 DIAGNOSIS — R569 Unspecified convulsions: Secondary | ICD-10-CM | POA: Diagnosis not present

## 2022-11-13 DIAGNOSIS — R4182 Altered mental status, unspecified: Secondary | ICD-10-CM

## 2022-11-13 DIAGNOSIS — G9341 Metabolic encephalopathy: Secondary | ICD-10-CM | POA: Diagnosis present

## 2022-11-13 DIAGNOSIS — Z79890 Hormone replacement therapy (postmenopausal): Secondary | ICD-10-CM | POA: Diagnosis not present

## 2022-11-13 DIAGNOSIS — Z8616 Personal history of COVID-19: Secondary | ICD-10-CM | POA: Diagnosis not present

## 2022-11-13 DIAGNOSIS — Z8249 Family history of ischemic heart disease and other diseases of the circulatory system: Secondary | ICD-10-CM

## 2022-11-13 DIAGNOSIS — E0500 Thyrotoxicosis with diffuse goiter without thyrotoxic crisis or storm: Secondary | ICD-10-CM | POA: Diagnosis present

## 2022-11-13 DIAGNOSIS — E870 Hyperosmolality and hypernatremia: Secondary | ICD-10-CM | POA: Diagnosis present

## 2022-11-13 DIAGNOSIS — E872 Acidosis, unspecified: Secondary | ICD-10-CM | POA: Diagnosis present

## 2022-11-13 DIAGNOSIS — R8281 Pyuria: Secondary | ICD-10-CM | POA: Insufficient documentation

## 2022-11-13 HISTORY — DX: Essential (primary) hypertension: I10

## 2022-11-13 HISTORY — DX: Tobacco use: Z72.0

## 2022-11-13 LAB — CBC WITH DIFFERENTIAL/PLATELET
Abs Immature Granulocytes: 0.06 10*3/uL (ref 0.00–0.07)
Basophils Absolute: 0.1 10*3/uL (ref 0.0–0.1)
Basophils Relative: 1 %
Eosinophils Absolute: 0 10*3/uL (ref 0.0–0.5)
Eosinophils Relative: 0 %
HCT: 26.5 % — ABNORMAL LOW (ref 36.0–46.0)
Hemoglobin: 9 g/dL — ABNORMAL LOW (ref 12.0–15.0)
Immature Granulocytes: 1 %
Lymphocytes Relative: 13 %
Lymphs Abs: 1.3 10*3/uL (ref 0.7–4.0)
MCH: 31.6 pg (ref 26.0–34.0)
MCHC: 34 g/dL (ref 30.0–36.0)
MCV: 93 fL (ref 80.0–100.0)
Monocytes Absolute: 1.2 10*3/uL — ABNORMAL HIGH (ref 0.1–1.0)
Monocytes Relative: 13 %
Neutro Abs: 6.8 10*3/uL (ref 1.7–7.7)
Neutrophils Relative %: 72 %
Platelets: 273 10*3/uL (ref 150–400)
RBC: 2.85 MIL/uL — ABNORMAL LOW (ref 3.87–5.11)
RDW: 14.6 % (ref 11.5–15.5)
WBC: 9.4 10*3/uL (ref 4.0–10.5)
nRBC: 0.2 % (ref 0.0–0.2)

## 2022-11-13 LAB — URINALYSIS, W/ REFLEX TO CULTURE (INFECTION SUSPECTED)
Bilirubin Urine: NEGATIVE
Glucose, UA: NEGATIVE mg/dL
Hgb urine dipstick: NEGATIVE
Ketones, ur: NEGATIVE mg/dL
Nitrite: NEGATIVE
Protein, ur: NEGATIVE mg/dL
Specific Gravity, Urine: 1.016 (ref 1.005–1.030)
pH: 5 (ref 5.0–8.0)

## 2022-11-13 LAB — LACTIC ACID, PLASMA
Lactic Acid, Venous: 1.1 mmol/L (ref 0.5–1.9)
Lactic Acid, Venous: 1.3 mmol/L (ref 0.5–1.9)

## 2022-11-13 LAB — COMPREHENSIVE METABOLIC PANEL WITH GFR
ALT: 21 U/L (ref 0–44)
AST: 22 U/L (ref 15–41)
Albumin: 3.6 g/dL (ref 3.5–5.0)
Alkaline Phosphatase: 43 U/L (ref 38–126)
Anion gap: 14 (ref 5–15)
BUN: 119 mg/dL — ABNORMAL HIGH (ref 6–20)
CO2: 11 mmol/L — ABNORMAL LOW (ref 22–32)
Calcium: 8.8 mg/dL — ABNORMAL LOW (ref 8.9–10.3)
Chloride: 112 mmol/L — ABNORMAL HIGH (ref 98–111)
Creatinine, Ser: 4.02 mg/dL — ABNORMAL HIGH (ref 0.44–1.00)
GFR, Estimated: 12 mL/min — ABNORMAL LOW
Glucose, Bld: 70 mg/dL (ref 70–99)
Potassium: 6.6 mmol/L (ref 3.5–5.1)
Sodium: 137 mmol/L (ref 135–145)
Total Bilirubin: 0.9 mg/dL (ref 0.3–1.2)
Total Protein: 8.7 g/dL — ABNORMAL HIGH (ref 6.5–8.1)

## 2022-11-13 LAB — CBG MONITORING, ED
Glucose-Capillary: 229 mg/dL — ABNORMAL HIGH (ref 70–99)
Glucose-Capillary: 69 mg/dL — ABNORMAL LOW (ref 70–99)
Glucose-Capillary: 68 mg/dL — ABNORMAL LOW (ref 70–99)
Glucose-Capillary: 88 mg/dL (ref 70–99)

## 2022-11-13 LAB — PROCALCITONIN: Procalcitonin: <0.1 ng/mL

## 2022-11-13 LAB — PROTIME-INR
INR: 1.4 — ABNORMAL HIGH (ref 0.8–1.2)
Prothrombin Time: 17.2 s — ABNORMAL HIGH (ref 11.4–15.2)

## 2022-11-13 LAB — POTASSIUM: Potassium: 4.7 mmol/L (ref 3.5–5.1)

## 2022-11-13 MED ORDER — SODIUM BICARBONATE 8.4 % IV SOLN
50.0000 meq | Freq: Once | INTRAVENOUS | Status: AC
  Administered 2022-11-13: 50 meq via INTRAVENOUS
  Filled 2022-11-13: qty 50

## 2022-11-13 MED ORDER — SODIUM CHLORIDE 0.9 % IV BOLUS
1000.0000 mL | Freq: Once | INTRAVENOUS | Status: AC
  Administered 2022-11-13: 1000 mL via INTRAVENOUS

## 2022-11-13 MED ORDER — DEXTROSE 50 % IV SOLN
1.0000 | Freq: Once | INTRAVENOUS | Status: AC
  Administered 2022-11-13: 50 mL via INTRAVENOUS
  Filled 2022-11-13: qty 50

## 2022-11-13 MED ORDER — SODIUM CHLORIDE 0.9 % IV SOLN
1.0000 g | INTRAVENOUS | Status: DC
  Administered 2022-11-13 – 2022-11-16 (×4): 1 g via INTRAVENOUS
  Filled 2022-11-13 (×4): qty 10

## 2022-11-13 MED ORDER — LORAZEPAM 2 MG/ML IJ SOLN: 2.0000 mg | INTRAMUSCULAR | Status: DC | PRN

## 2022-11-13 MED ORDER — NICOTINE 21 MG/24HR TD PT24: 21.0000 mg | MEDICATED_PATCH | Freq: Every day | TRANSDERMAL | Status: DC | PRN

## 2022-11-13 MED ORDER — HYDROXYZINE HCL 50 MG PO TABS
50.0000 mg | ORAL_TABLET | Freq: Every day | ORAL | Status: DC
  Administered 2022-11-13 – 2022-11-15 (×3): 50 mg via ORAL
  Filled 2022-11-13 (×3): qty 1

## 2022-11-13 MED ORDER — SODIUM CHLORIDE 0.9 % IV BOLUS
500.0000 mL | Freq: Once | INTRAVENOUS | Status: AC
  Administered 2022-11-13: 500 mL via INTRAVENOUS

## 2022-11-13 MED ORDER — HEPARIN SODIUM (PORCINE) 5000 UNIT/ML IJ SOLN
5000.0000 [IU] | Freq: Three times a day (TID) | INTRAMUSCULAR | Status: DC
  Administered 2022-11-13 – 2022-11-16 (×8): 5000 [IU] via SUBCUTANEOUS
  Filled 2022-11-13 (×8): qty 1

## 2022-11-13 MED ORDER — LORAZEPAM 2 MG/ML IJ SOLN
1.0000 mg | INTRAMUSCULAR | Status: DC | PRN
  Administered 2022-11-13: 1 mg via INTRAVENOUS
  Filled 2022-11-13: qty 1

## 2022-11-13 MED ORDER — ONDANSETRON HCL 4 MG/2ML IJ SOLN: 4.0000 mg | Freq: Four times a day (QID) | INTRAMUSCULAR | Status: DC | PRN

## 2022-11-13 MED ORDER — HYDRALAZINE HCL 20 MG/ML IJ SOLN: 5.0000 mg | INTRAMUSCULAR | Status: AC | PRN

## 2022-11-13 MED ORDER — PATIROMER SORBITEX CALCIUM 8.4 G PO PACK
16.8000 g | PACK | Freq: Every day | ORAL | Status: DC
  Administered 2022-11-13 – 2022-11-15 (×3): 16.8 g via ORAL
  Filled 2022-11-13 (×4): qty 2

## 2022-11-13 MED ORDER — ACETAMINOPHEN 650 MG RE SUPP: 650.0000 mg | Freq: Four times a day (QID) | RECTAL | Status: DC | PRN

## 2022-11-13 MED ORDER — LEVOTHYROXINE SODIUM 50 MCG PO TABS
50.0000 ug | ORAL_TABLET | Freq: Every day | ORAL | Status: DC
  Administered 2022-11-14 – 2022-11-16 (×3): 50 ug via ORAL
  Filled 2022-11-13 (×3): qty 1

## 2022-11-13 MED ORDER — SODIUM CHLORIDE 0.9 % IV SOLN: INTRAVENOUS | Status: AC

## 2022-11-13 MED ORDER — ONDANSETRON HCL 4 MG PO TABS: 4.0000 mg | ORAL_TABLET | Freq: Four times a day (QID) | ORAL | Status: DC | PRN

## 2022-11-13 MED ORDER — INSULIN ASPART 100 UNIT/ML IJ SOLN
6.0000 [IU] | Freq: Once | INTRAMUSCULAR | Status: AC
  Administered 2022-11-13: 6 [IU] via INTRAVENOUS
  Filled 2022-11-13: qty 1

## 2022-11-13 MED ORDER — ACETAMINOPHEN 325 MG PO TABS: 650.0000 mg | ORAL_TABLET | Freq: Four times a day (QID) | ORAL | Status: DC | PRN

## 2022-11-13 MED ORDER — SENNOSIDES-DOCUSATE SODIUM 8.6-50 MG PO TABS: 1.0000 | ORAL_TABLET | Freq: Every evening | ORAL | Status: DC | PRN

## 2022-11-13 MED ORDER — BUDESONIDE 0.25 MG/2ML IN SUSP
0.2500 mg | Freq: Two times a day (BID) | RESPIRATORY_TRACT | Status: DC
  Administered 2022-11-13 – 2022-11-16 (×5): 0.25 mg via RESPIRATORY_TRACT
  Filled 2022-11-13 (×5): qty 2

## 2022-11-13 MED ORDER — FLUTICASONE FUROATE 100 MCG/ACT IN AEPB: 1.0000 | INHALATION_SPRAY | Freq: Every day | RESPIRATORY_TRACT | Status: DC

## 2022-11-13 MED ORDER — ALBUTEROL SULFATE (2.5 MG/3ML) 0.083% IN NEBU: 2.5000 mg | INHALATION_SOLUTION | Freq: Four times a day (QID) | RESPIRATORY_TRACT | Status: DC | PRN

## 2022-11-13 MED ORDER — CALCIUM GLUCONATE 10 % IV SOLN
1.0000 g | Freq: Once | INTRAVENOUS | Status: AC
  Administered 2022-11-13: 1 g via INTRAVENOUS
  Filled 2022-11-13: qty 10

## 2022-11-13 NOTE — ED Notes (Signed)
ERP notified of pt critical potassium, see orders.

## 2022-11-13 NOTE — ED Notes (Signed)
Pt gone to MRI 

## 2022-11-13 NOTE — ED Notes (Signed)
Brief placed on pt and warm blanket applied

## 2022-11-13 NOTE — Assessment & Plan Note (Addendum)
Home lisinopril, metoprolol succinate, spironolactone 25 mg daily, furosemide 20 mg daily were not resumed on admission AM team to resume home antihypertensive medications when the benefits outweigh the risk Hydralazine 5 mg IV every 4 hours as needed for SBP greater than 180, 1 day ordered

## 2022-11-13 NOTE — Assessment & Plan Note (Signed)
Patient is not on anticoagulation And given elevated INR with unclear last known normal, per neurology, patient is not a candidate for TNK

## 2022-11-13 NOTE — ED Notes (Signed)
Purwick placed

## 2022-11-13 NOTE — Hospital Course (Signed)
[name] is a 57 year old female with history of COVID infection, no prior CKD, hypothyroid, hypertension, who presents to the emergency department for chief concerns of altered mental status from home. 08/20: in ED, sodium 137, potassium 6.6, chloride 112, bicarb 11, BUN of 119, serum creatinine of 4.02, EGFR of 12, nonfasting blood glucose 70, WBC 9.4, hemoglobin 9.0, platelets of 273.Lactic acid was 1.1.  UA was positive for moderate leukocytes. CT of the head without contrast: Read as no acute intracranial abnormality.  Air-fluid levels in the bilateral sphenoid sinuses which can be seen in setting of acute sinusitis. CT renal stone study:no concerns MRI brain normal. ED treatment: Calcium gluconate 1 g IV once, dextrose 1 amp, insulin 60 unit one-time dose, Veltassa 16.8 g packet, sodium bicarbonate injection 50 mill equivalents IV one-time dose. Neurology reviewed chart, no apparent neuro cause for symptoms but ok for formal consult if AMS not correcting w/ improved lytes/metab issues.  08/21: hypernatremia and increasing hyperchloremia, IV fluids to bicarb in D5W and monitor BMP closely.    Consultants:  none  Procedures: none      ASSESSMENT & PLAN:   Principal Problem:   Hyperkalemia Active Problems:   AKI (acute kidney injury) (HCC)   Altered mental status   History of COVID-19   Pyuria   Hypothyroidism   Essential hypertension   Elevated INR   Expressive aphasia   Diverticulosis of colon   Pyuria UTI Present on admission, in setting of acute kidney injury, we will also treat as urinary tract infection Ceftriaxone 1 g IV daily, 5 days ordered  Hyperkalemia Status post ED treatment: Calcium gluconate 1 g once, dextrose 50 mL (1 amp) IV one-time dose, sodium bicarbonate 50 mill equivalent (1 amp), sodium chloride 500 mL bolus, insulin and aspart 6 units IV one-time dose 0.9% NaCl infusion at 125 mL/h, 1 day ordered Trend BMP --> normal K to 5.0  08/21  Hypernatremia  Acidosis / Bicarb loss d/t likely AKI, UTI Likely related to NS infusion w/ fluid restricted diet  Starting bicarb infusion in D5W Trend BMP frequently    Altered mental status likely secondary to current metabolic derangement as well as UTI Stroke ruled out At bedside, patient has left upper and left lower extremity weakness compared to the right Continue correction of electrolytes as well as UTI treatment Monitor neurochecks closely   AKI (acute kidney injury) (HCC) AKI improving Monitor renal function Avoid nephrotoxic medications Renally dose all drugs   Expressive aphasia New per family.  Family last saw patient about 2 weeks ago and patient lives at home by herself.   Patient underwent stroke workup and MRI of the brain were negative as well as EEG Chart reviewed by neurologist - advise address metabolic issues as above, consult if needed    Elevated INR Patient is not on anticoagulation And given elevated INR with unclear last known normal, per neurology, patient is not a candidate for TNK   Essential hypertension Continue metoprolol Holding lisinopril, Lasix and spironolactone on account of AKI   Hypothyroidism Home levothyroxine 50 mcg daily before breakfast resumed Check TSH    Pyuria UTI present on admission  Present on admission, in setting of acute kidney injury, we will also treat as urinary tract infection Continue ceftriaxone 1 g IV daily, 5 days ordered   Leukocytosis - reactive vs UTI (no leukocytosis on admission)  More likely d/t UTI given L shift  Trend CBC   DVT prophylaxis: heparin sq Pertinent IV fluids/nutrition:  bicarb in D5W Central lines / invasive devices: none   Code Status: full code ACP documentation reviewed: none on file  Current Admission Status: inpatient  TOC needs / Dispo plan: TBD Barriers to discharge / significant pending items: correction acidosis

## 2022-11-13 NOTE — Assessment & Plan Note (Addendum)
New per family at bedside.  Family last saw patient about 2 weeks ago and patient lives at home by herself.  Unclear how long this has been going on Stroke workup in progress, see above

## 2022-11-13 NOTE — Assessment & Plan Note (Signed)
Suspect secondary to dehydration Status post sodium chloride 500 mL bolus per EDP Will continue with sodium chloride infusion at 125 mL/h, 1 day ordered Recheck BMP in the a.m.

## 2022-11-13 NOTE — Procedures (Signed)
Eeg will be done tomorrow due to time constraints.

## 2022-11-13 NOTE — ED Provider Notes (Addendum)
[facility] Provider Note    Event Date/Time   First MD Initiated Contact with Patient 11/13/22 1002     (approximate)   History   Altered Mental Status  Level V Caveat:  nonverbal  HPI  Monique Solis is a 57 y.o. female   history of diabetes thyroid disorder presents to the ER with altered mental status from family.  Reportedly on Augmentin not certain as to why.  Patient has been nonverbal for the past 12 hours.  She shakes her head when asked if there is any pain or discomfort.  Very poor historian.      Physical Exam   Triage Vital Signs: ED Triage Vitals  Encounter Vitals Group     BP 11/13/22 0952 (!) 91/55     Systolic BP Percentile --      Diastolic BP Percentile --      Pulse Rate 11/13/22 0952 (!) 55     Resp 11/13/22 0952 17     Temp 11/13/22 0952 99 F (37.2 C)     Temp Source 11/13/22 0952 Oral     SpO2 11/13/22 0952 100 %     Weight 11/13/22 0954 138 lb 14.2 oz (63 kg)     Height 11/13/22 0954 5' (1.524 m)     Head Circumference --      Peak Flow --      Pain Score 11/13/22 0954 0     Pain Loc --      Pain Education --      Exclude from Growth Chart --     Most recent vital signs: Vitals:   11/13/22 1015 11/13/22 1030  BP:  99/76  Pulse: (!) 54 63  Resp: 19 18  Temp:    SpO2: 100% 94%     Constitutional: Alert  Eyes: Conjunctivae are normal.  Head: Atraumatic. Nose: No congestion/rhinnorhea. Mouth/Throat: Mucous membranes are moist.   Neck: Painless ROM.  Cardiovascular:   Good peripheral circulation. Respiratory: Normal respiratory effort.  No retractions.  Gastrointestinal: Soft and nontender.  Musculoskeletal:  no deformity Neurologic:  MAE spontaneously. No gross focal neurologic deficits are appreciated. No facial droop.  nonverbal Skin:  Skin is warm, dry and intact. No rash noted. Psychiatric: Mood and affect are normal. Speech and behavior are normal.    ED Results / Procedures / Treatments    Labs (all labs ordered are listed, but only abnormal results are displayed) Labs Reviewed  COMPREHENSIVE METABOLIC PANEL - Abnormal; Notable for the following components:      Result Value   Potassium 6.6 (*)    Chloride 112 (*)    CO2 11 (*)    BUN 119 (*)    Creatinine, Ser 4.02 (*)    Calcium 8.8 (*)    Total Protein 8.7 (*)    GFR, Estimated 12 (*)    All other components within normal limits  CBC WITH DIFFERENTIAL/PLATELET - Abnormal; Notable for the following components:   RBC 2.85 (*)    Hemoglobin 9.0 (*)    HCT 26.5 (*)    Monocytes Absolute 1.2 (*)    All other components within normal limits  PROTIME-INR - Abnormal; Notable for the following components:   Prothrombin Time 17.2 (*)    INR 1.4 (*)    All other components within normal limits  CBG MONITORING, ED - Abnormal; Notable for the following components:   Glucose-Capillary 69 (*)    All other components within normal limits  CULTURE, BLOOD (ROUTINE X 2)  CULTURE, BLOOD (ROUTINE X 2)  LACTIC ACID, PLASMA  LACTIC ACID, PLASMA  URINALYSIS, W/ REFLEX TO CULTURE (INFECTION SUSPECTED)     EKG  ED ECG REPORT I, Willy Eddy, the attending physician, personally viewed and interpreted this ECG.   Date: 11/13/2022  EKG Time: 9:56  Rate: 140  Rhythm: afib  Axis: normal  Intervals:normal qt  ST&T Change: hyperacute t waves, nonspecific st abn    RADIOLOGY Please see ED Course for my review and interpretation.  I personally reviewed all radiographic images ordered to evaluate for the above acute complaints and reviewed radiology reports and findings.  These findings were personally discussed with the patient.  Please see medical record for radiology report.    PROCEDURES:  Critical Care performed: Yes, see critical care procedure note(s)  .Critical Care  Performed by: Willy Eddy, MD Authorized by: Willy Eddy, MD   Critical care provider statement:    Critical care time  (minutes):  40   Critical care was necessary to treat or prevent imminent or life-threatening deterioration of the following conditions:  Renal failure   Critical care was time spent personally by me on the following activities:  Ordering and performing treatments and interventions, ordering and review of laboratory studies, ordering and review of radiographic studies, pulse oximetry, re-evaluation of patient's condition, review of old charts, obtaining history from patient or surrogate, examination of patient, evaluation of patient's response to treatment, discussions with primary provider, discussions with consultants and development of treatment plan with patient or surrogate    MEDICATIONS ORDERED IN ED: Medications  patiromer Lelon Perla) packet 16.8 g (16.8 g Oral Given 11/13/22 1205)  sodium chloride 0.9 % bolus 500 mL (500 mLs Intravenous New Bag/Given 11/13/22 1204)  calcium gluconate inj 10% (1 g) URGENT USE ONLY! (1 g Intravenous Given 11/13/22 1154)  sodium bicarbonate injection 50 mEq (50 mEq Intravenous Given 11/13/22 1150)  dextrose 50 % solution 50 mL (50 mLs Intravenous Given 11/13/22 1147)  insulin aspart (novoLOG) injection 6 Units (6 Units Intravenous Given 11/13/22 1215)     IMPRESSION / MDM / ASSESSMENT AND PLAN / ED COURSE  I reviewed the triage vital signs and the nursing notes.                              Differential diagnosis includes, but is not limited to, Dehydration, sepsis, pna, uti, hypoglycemia, cva, drug effect, withdrawal, encephalitis   Patient presenting to the ER for evaluation of symptoms as described above.  Based on symptoms, risk factors and considered above differential, this presenting complaint could reflect a potentially life-threatening illness therefore the patient will be placed on continuous pulse oximetry and telemetry for monitoring.  Laboratory evaluation will be sent to evaluate for the above complaints.      Clinical Course as of 11/13/22  1239  Mon Nov 13, 2022  1113 CT head on my review and interpretation without evidence of SDH or IPH. [PR]  1133 Patient is hyperkalemic to 6.6 with EKG changes.  Will give IV fluids, Veltassa, insulin, bicarb. [PR]  1201 Family does report patient was COVID-positive 2 weeks ago with very poor p.o. intake.  This may all be secondary to his significant dehydration and AKI will give IV fluids.  CT imaging of abdomen pelvis ordered to evaluate for obstructive pathology. [PR]  1238 CT imaging my review and interpretation does not show any evidence of obstruction will  consult hospitalist for admission [PR]    Clinical Course User Index [PR] Willy Eddy, MD     FINAL CLINICAL IMPRESSION(S) / ED DIAGNOSES   Final diagnoses:  AKI (acute kidney injury) (HCC)  Acute hyperkalemia  Altered mental status, unspecified altered mental status type     Rx / DC Orders   ED Discharge Orders     None        Note:  This document was prepared using Dragon voice recognition software and may include unintentional dictation errors.    Willy Eddy, MD 11/13/22 1232    Willy Eddy, MD 11/13/22 1239

## 2022-11-13 NOTE — Assessment & Plan Note (Signed)
Present on admission, in setting of acute kidney injury, we will also treat as urinary tract infection Ceftriaxone 1 g IV daily, 5 days ordered

## 2022-11-13 NOTE — Assessment & Plan Note (Signed)
-   Check procalcitonin 

## 2022-11-13 NOTE — ED Triage Notes (Signed)
Pt arrives from home via [facility] eMS w/ c/o AMS "for about 12 hours". LSN 2017. According to EMS pt is normally talkative, answering questions, is not doing that today. Pt is still following commands, pt is alert. EMS reports "stroke screen is inconclusive". Pt has been taking augmentin, family not sure why. Recent covid dx  96/56 98.5

## 2022-11-13 NOTE — Progress Notes (Signed)
[facility]ist Note  I received a secure chat at 3:56p from nursing: 'Hey ED 18 her pressures have been soft, but just rechecked it and it is 79/40, I am going to try to get another IV, because she keeps bending arm for the fluids ' and then at 3:57p: Just did a different cuff and it is 95/60 :) sorry.'  Complete echo from [facility] at 10/07/2021 reviewed, noting that estimated left ejection fraction was greater than 55%.  Right ventricle is normal in size, with normal systolic function.  Left ventricle was normal in size with normal wall thickness.  NS Bolus 1 L placed at 4 PM.  I also message nursing informing her of the order at 4 PM: 'NS 1 L bolus placed. Thank you'  Nursing has not checked secure chat at the time of this dictation  Dr. Sedalia Muta

## 2022-11-13 NOTE — ED Notes (Addendum)
Pt changed and assisted to use the bed pan, linens changed and family at bedside. Pt did help assist with rolling and lifting hips during change

## 2022-11-13 NOTE — Assessment & Plan Note (Addendum)
With new expressive aphasia and left upper and lower extremity weakness Per nursing documentation, patient's last known normal was approximately 9 PM on 8/18 At bedside, patient has left upper and left lower extremity weakness compared to the right MRI of the brain without contrast, complete echo have been ordered Routine EEG ordered per neurology recommendation Low clinical suspicion for pneumonia however given altered mental status, will check procalcitonin and treat with antibiotics if indicated Fall and aspiration precaution Neurology has been consulted, Dr. Iver Nestle is aware.  Patient is not a candidate for TNK given unclear last known normal and elevated INR

## 2022-11-13 NOTE — H&P (Addendum)
History and Physical   Monique Solis ZOX:[id number] DOB: Jul 02, 1965 DOA: 11/13/2022  PCP: Marya Fossa, PA-C  Patient coming from: Home via EMS  I have personally briefly reviewed patient's old medical records in [facility] EMR.  Chief Concern: Altered mental status  HPI: Monique Solis is a 57 year old female with history of COVID infection, no prior CKD, hypothyroid, hypertension, who presents to the emergency department for chief concerns of altered mental status from home.  Vitals in the ED showed temperature of 99, respiration rate of 14, heart rate of 57, blood pressure 93/71 and improved to 195/160, SpO2 of 94% on room air.  Serum sodium is 137, potassium 6.6, chloride 112, bicarb 11, BUN of 119, serum creatinine of 4.02, EGFR of 12, nonfasting blood glucose 70, WBC 9.4, hemoglobin 9.0, platelets of 273.  Lactic acid was 1.1.  UA was positive for moderate leukocytes.  CT of the head without contrast: Read as no acute intracranial abnormality.  Air-fluid levels in the bilateral sphenoid sinuses which can be seen in setting of acute sinusitis.  CT renal stone study: Has been ordered and pending read  ED treatment: Calcium gluconate 1 g IV once, dextrose 1 amp, insulin 60 unit one-time dose, [place] 16.8 g packet, sodium bicarbonate injection 50 mill equivalents IV one-time dose. ----------------------------------- At bedside, she was able to tell me her name is Monique Solis.  She was able to identify her granddaughter at bedside and identify his granddaughter by name.  She was also able to identify one of her sisters by name at bedside.  2 daughters came in about 5 minutes later and patient was able to identify that they were her daughters however patient was not able to tell me her daughter's names.  At bedside, patient attempted to follow my commands.  She was able to squeeze my fingers with her right hand and she was able to bend her right knee and wiggle her right toes. When asked  her to do the same on her left side, I can see clearly that there was an attempt however patient was not able to squeeze my fingers with her left hand or bend her left knee.  Daughters at bedside states that this is abnormal and that patient at baseline ambulates on her own without walking assistance device.  Patient denies being in pain at bedside.  She was minimally verbal, and family reports that this is not normal as patient at baseline is very talkative.  Social history: She lives at home by herself. She is a daily tobacco user, 1 ppd. She drinks two small beers per day. She denies drug use. She works at [employer].  ROS: Unable to complete as patient has decreased verbal skills at bedside, possible expressive aphasia  ED Course: Discussed with emergency medicine provider, patient requiring hospitalization for chief concerns of hypokalemia.  Assessment/Plan  Principal Problem:   Hyperkalemia Active Problems:   AKI (acute kidney injury) (HCC)   Altered mental status   History of COVID-19   Pyuria   Hypothyroidism   Essential hypertension   Elevated INR   Expressive aphasia   Assessment and Plan:  * Hyperkalemia Status post ED treatment: Calcium gluconate 1 g once, dextrose 50 mL (1 amp) IV one-time dose, sodium bicarbonate 50 mill equivalent (1 amp), sodium chloride 500 mL bolus, insulin and aspart 6 units IV one-time dose We will recheck potassium level on admission stat Continue with sodium chloride infusion at 125 mL/h, 1 day ordered Recheck BMP in  the a.m.  Altered mental status With new expressive aphasia and left upper and lower extremity weakness Per nursing documentation, patient's last known normal was approximately 9 PM on 8/18 At bedside, patient has left upper and left lower extremity weakness compared to the right MRI of the brain without contrast, complete echo have been ordered Routine EEG ordered per neurology recommendation Low clinical suspicion for  pneumonia however given altered mental status, will check procalcitonin and treat with antibiotics if indicated Fall and aspiration precaution Neurology has been consulted, Dr. Iver Nestle is aware.  Patient is not a candidate for TNK given unclear last known normal and elevated INR  AKI (acute kidney injury) (HCC) Suspect secondary to dehydration Status post sodium chloride 500 mL bolus per EDP Will continue with sodium chloride infusion at 125 mL/h, 1 day ordered Recheck BMP in the a.m.  Expressive aphasia New per family at bedside.  Family last saw patient about 2 weeks ago and patient lives at home by herself.  Unclear how long this has been going on Stroke workup in progress, see above  Elevated INR Patient is not on anticoagulation And given elevated INR with unclear last known normal, per neurology, patient is not a candidate for TNK  Essential hypertension Home lisinopril, metoprolol succinate, spironolactone 25 mg daily, furosemide 20 mg daily were not resumed on admission AM team to resume home antihypertensive medications when the benefits outweigh the risk Hydralazine 5 mg IV every 4 hours as needed for SBP greater than 180, 1 day ordered  Hypothyroidism Home levothyroxine 50 mcg daily before breakfast resumed  Pyuria Present on admission, in setting of acute kidney injury, we will also treat as urinary tract infection Ceftriaxone 1 g IV daily, 5 days ordered  History of COVID-19 Check procalcitonin  Chart reviewed.   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 She has never used smokeless tobacco. She reports current alcohol use. She reports that she does not use drugs.  No Known Allergies Family History  Problem Relation Age of Onset   Hypertension Mother    Stroke Mother    Hypertension Sister    Family history: Family history reviewed and pertinent mother with history of stroke.  Prior to Admission medications   Medication Sig Start Date End Date Taking? Authorizing Provider  levothyroxine (SYNTHROID) 50 MCG tablet Take 50 mcg by mouth daily before breakfast.   Yes [provider]  lisinopril (ZESTRIL) 40 MG tablet Take 40 mg by mouth daily.   Yes [provider]  metoprolol succinate (TOPROL-XL) 50 MG 24 hr tablet Take 50 mg by mouth daily. Take with or immediately following a meal.   Yes [provider]  naproxen (EC NAPROSYN) 500 MG EC tablet Take 500 mg by mouth 2 (two) times daily with a meal.   Yes [provider]  potassium chloride SA (KLOR-CON M) 20 MEQ tablet Take 20 mEq by mouth 2 (two) times daily.  Yes [provider]  spironolactone (ALDACTONE) 25 MG tablet Take 25 mg by mouth daily.   Yes [provider]  furosemide (LASIX) 20 MG tablet Take 1 tablet (20 mg total) by mouth daily for 5 days. Patient taking differently: Take 40 mg by mouth daily. 02/27/19 03/04/19  Chesley Noon, MD   Physical Exam: Vitals:   11/13/22 0954 11/13/22 1000 11/13/22 1015 11/13/22 1030  BP:  93/71  99/76  Pulse:  (!) 57 (!) 54 63  Resp:  14 19 18   Temp:      TempSrc:      SpO2:  100% 100% 94%  Weight: 63 kg     Height: 5' (1.524 m)      Constitutional: appears frail, older than chronological age, NAD, calm Eyes: PERRL, lids and conjunctivae normal ENMT: Mucous membranes are moist. Posterior pharynx clear of  any exudate or lesions. Teeth loss. Hearing appropriate Neck: normal, supple, no masses, no thyromegaly Respiratory: clear to auscultation bilaterally, no wheezing, no crackles. Normal respiratory effort. No accessory muscle use.  Cardiovascular: Regular rate and rhythm, no murmurs / rubs / gallops. No extremity edema. 2+ pedal pulses. No carotid bruits.  Abdomen: no tenderness, no masses palpated, no hepatosplenomegaly. Bowel sounds positive.  Musculoskeletal: no clubbing / cyanosis. No joint deformity upper and lower extremities. Good ROM, no contractures, no atrophy. Normal muscle tone.  Skin: no rashes, lesions, ulcers. No induration Neurologic: Sensation intact. Strength is diminished in the left upper and left lower extremities. She is able to move her hands and legs, it is significantly weaker on the left side compared to the right. Psychiatric: Normal judgment and insight. Alert and oriented x 3. Normal mood.   EKG: independently reviewed, showing atrial fibrillation with a rate of 136, QTc 344  Chest x-ray on Admission: I personally reviewed and I agree with radiologist reading as below.  DG Chest Port 1 View  Result Date: 11/13/2022 CLINICAL DATA:  Sepsis.  Altered mental status. EXAM: PORTABLE CHEST 1 VIEW COMPARISON:  CXR 11/03/22 FINDINGS: No pleural effusion. No pneumothorax. No focal airspace opacity. Normal cardiac and mediastinal contours. No radiographically apparent displaced rib fractures. Visualized upper abdomen is unremarkable. IMPRESSION: No focal airspace opacity Electronically Signed   By: Lorenza Cambridge M.D.   On: 11/13/2022 12:20   CT HEAD WO CONTRAST ( )  Result Date: 11/13/2022 CLINICAL DATA:  Altered mental status EXAM: CT HEAD WITHOUT CONTRAST TECHNIQUE: Contiguous axial images were obtained from the base of the skull through the vertex without intravenous contrast. RADIATION DOSE REDUCTION: This exam was performed according to the departmental dose-optimization  program which includes automated exposure control, adjustment of the mA and/or kV according to patient size and/or use of iterative reconstruction technique. COMPARISON:  None Available. FINDINGS: Brain: No evidence of acute infarction, hemorrhage, hydrocephalus, extra-axial collection or mass lesion/mass effect. Vascular: No hyperdense vessel or unexpected calcification. Skull: Normal. Negative for fracture or focal lesion. Sinuses/Orbits: No middle ear or mastoid effusion. Air-fluid levels in the bilateral sphenoid sinuses which can be seen in the setting of acute sinusitis. Orbits are unremarkable. Other: None. IMPRESSION: 1. No acute intracranial abnormality. 2. Air-fluid levels in the bilateral sphenoid sinuses which can be seen in the setting of acute sinusitis. Electronically Signed   By: Lorenza Cambridge M.D.   On: 11/13/2022 12:18    Labs on Admission: I have personally reviewed following labs  CBC: Recent Labs  Lab 11/13/22 1008  WBC 9.4  NEUTROABS 6.8  HGB 9.0*  HCT 26.5*  MCV 93.0  PLT 273   Basic Metabolic Panel: Recent Labs  Lab 11/13/22 1008 11/13/22 1444  NA 137  --   K 6.6* 4.7  CL 112*  --   CO2 11*  --   GLUCOSE 70  --   BUN 119*  --   CREATININE 4.02*  --   CALCIUM 8.8*  --    GFR: Estimated Creatinine Clearance: 12.8 mL/min (A) (by C-G formula based on SCr of 4.02 mg/dL (H)).  Liver Function Tests: Recent Labs  Lab 11/13/22 1008  AST 22  ALT 21  ALKPHOS 43  BILITOT 0.9  PROT 8.7*  ALBUMIN 3.6   Coagulation Profile: Recent Labs  Lab 11/13/22 1008  INR 1.4*   CBG: Recent Labs  Lab 11/13/22 1145 11/13/22 1214 11/13/22 1336 11/13/22 1437  GLUCAP 69* 229* 68* 88   Urine analysis:    Component Value Date/Time   COLORURINE YELLOW (A) 11/13/2022 1223   APPEARANCEUR HAZY (A) 11/13/2022 1223   LABSPEC 1.016 11/13/2022 1223   PHURINE 5.0 11/13/2022 1223   GLUCOSEU NEGATIVE 11/13/2022 1223   HGBUR NEGATIVE 11/13/2022 1223   BILIRUBINUR NEGATIVE  11/13/2022 1223   KETONESUR NEGATIVE 11/13/2022 1223   PROTEINUR NEGATIVE 11/13/2022 1223   NITRITE NEGATIVE 11/13/2022 1223   LEUKOCYTESUR MODERATE (A) 11/13/2022 1223   This document was prepared using Dragon Voice Recognition software and may include unintentional dictation errors.  Dr. Sedalia Muta [facility]  If 7PM-7AM, please contact overnight-coverage provider If 7AM-7PM, please contact day attending provider [email or web address]  11/13/2022, 3:30 PM

## 2022-11-13 NOTE — Assessment & Plan Note (Signed)
-   Home levothyroxine 50 mcg daily before breakfast resumed 

## 2022-11-13 NOTE — ED Notes (Signed)
Family at bedside, lunch at bedside

## 2022-11-13 NOTE — Assessment & Plan Note (Signed)
Status post ED treatment: Calcium gluconate 1 g once, dextrose 50 mL (1 amp) IV one-time dose, sodium bicarbonate 50 mill equivalent (1 amp), sodium chloride 500 mL bolus, insulin and aspart 6 units IV one-time dose We will recheck potassium level on admission stat Continue with sodium chloride infusion at 125 mL/h, 1 day ordered Recheck BMP in the a.m.

## 2022-11-13 NOTE — ED Notes (Addendum)
Hospitalist notified of pt blood pressure, see orders, 1 L NS. New cuff placed and pt blood pressure increased

## 2022-11-14 ENCOUNTER — Ambulatory Visit: Payer: Medicare HMO

## 2022-11-14 DIAGNOSIS — R4182 Altered mental status, unspecified: Secondary | ICD-10-CM | POA: Diagnosis not present

## 2022-11-14 DIAGNOSIS — R569 Unspecified convulsions: Secondary | ICD-10-CM

## 2022-11-14 DIAGNOSIS — E875 Hyperkalemia: Secondary | ICD-10-CM | POA: Diagnosis not present

## 2022-11-14 LAB — BASIC METABOLIC PANEL
Anion gap: 9 (ref 5–15)
BUN: 66 mg/dL — ABNORMAL HIGH (ref 6–20)
CO2: 11 mmol/L — ABNORMAL LOW (ref 22–32)
Calcium: 8.4 mg/dL — ABNORMAL LOW (ref 8.9–10.3)
Chloride: 126 mmol/L — ABNORMAL HIGH (ref 98–111)
Creatinine, Ser: 1.47 mg/dL — ABNORMAL HIGH (ref 0.44–1.00)
GFR, Estimated: 41 mL/min — ABNORMAL LOW (ref >60)
Glucose, Bld: 139 mg/dL — ABNORMAL HIGH (ref 70–99)
Potassium: 5.2 mmol/L — ABNORMAL HIGH (ref 3.5–5.1)
Sodium: 146 mmol/L — ABNORMAL HIGH (ref 135–145)

## 2022-11-14 LAB — CBC
HCT: 24.1 % — ABNORMAL LOW (ref 36.0–46.0)
Hemoglobin: 8.4 g/dL — ABNORMAL LOW (ref 12.0–15.0)
MCH: 31.5 pg (ref 26.0–34.0)
MCHC: 34.9 g/dL (ref 30.0–36.0)
MCV: 90.3 fL (ref 80.0–100.0)
Platelets: 247 10*3/uL (ref 150–400)
RBC: 2.67 MIL/uL — ABNORMAL LOW (ref 3.87–5.11)
RDW: 14.3 % (ref 11.5–15.5)
WBC: 10.5 10*3/uL (ref 4.0–10.5)
nRBC: 0.2 % (ref 0.0–0.2)

## 2022-11-14 MED ORDER — THIAMINE HCL 100 MG/ML IJ SOLN
100.0000 mg | Freq: Every day | INTRAMUSCULAR | Status: AC
  Administered 2022-11-14 – 2022-11-16 (×3): 100 mg via INTRAVENOUS
  Filled 2022-11-14 (×3): qty 2

## 2022-11-14 MED ORDER — METOPROLOL SUCCINATE ER 50 MG PO TB24
50.0000 mg | ORAL_TABLET | Freq: Every day | ORAL | Status: DC
  Administered 2022-11-14 – 2022-11-15 (×2): 50 mg via ORAL
  Filled 2022-11-14 (×2): qty 1

## 2022-11-14 NOTE — Procedures (Signed)
Patient Name: Monique Solis  MRN: [id number]  Epilepsy Attending: Charlsie Quest  Referring Physician/Provider: Lovenia Kim, DO  Date: 11/14/2022 Duration: 30.46 mins  Patient history: 57 yo F with altered mental status, new expressive aphasia and left upper and lower extremity weakness. EEG to evaluate for seizure  Level of alertness: Awake  AEDs during EEG study: None  Technical aspects: This EEG study was done with scalp electrodes positioned according to the 10-20 International system of electrode placement. Electrical activity was reviewed with band pass filter of 1-70Hz , sensitivity of 7 uV/mm, display speed of 68mm/sec with a 60Hz  notched filter applied as appropriate. EEG data were recorded continuously and digitally stored.  Video monitoring was available and reviewed as appropriate.  Description: EEG showed continuous generalized 3 to 5 Hz theta-delta slowing.  Hyperventilation and photic stimulation were not performed.    Of note, study was technically difficult due to significant myogenic artifact.  ABNORMALITY - Continuous slow, generalized  IMPRESSION: This technically difficult study is suggestive of moderate diffuse encephalopathy, nonspecific etiology. No seizures or epileptiform discharges were seen throughout the recording.  [name]

## 2022-11-14 NOTE — Plan of Care (Signed)
Discussed with primary team, MRI and EEG negative, multiple toxic/metabolic derangements that are likely playing a role in patient's confusion. Primary team to reach out if mental status not improving with supportive care or if there are other neurological questions/concerns  Brooke Dare MD-PhD [facility] [phone] [facility] coverage for [facility] is from 8 AM to 4 AM in-house and 4 PM to 8 PM by telephone/video. 8 PM to 8 AM emergent questions or overnight urgent questions should be addressed to Teleneurology On-call or [facility] neurohospitalist; contact information can be found on AMION

## 2022-11-14 NOTE — Progress Notes (Signed)
  Progress Note   Patient: Monique Solis ZOX:[id number] DOB: [birth year] DOA: 11/13/2022     1 DOS: the patient was seen and examined on 11/14/2022   Subjective: Patient seen and examined at bedside this morning in the presence of patient's daughter Patient still appears confused however improving Denies nausea vomiting abdominal pain chest pain or cough  Brief hospital course:  From HPI "[name] is a 57 year old female with history of COVID infection, no prior CKD, hypothyroid, hypertension, who presents to the emergency department for chief concerns of altered mental status from home."  Assessment and Plan: Hyperkalemia-improved  Patient received hyperkalemia cocktail Continue monitoring potassium level Continue IV fluid resuscitation   Altered mental status likely secondary to current metabolic derangement as well as UTI Stroke ruled out At bedside, patient has left upper and left lower extremity weakness compared to the right Echocardiogram done results pending Continue correction of electrolytes as well as UTI treatment Monitor neurochecks closely  AKI (acute kidney injury) (HCC) AKI improving Continue IV fluid Monitor renal function Avoid nephrotoxic medications Renally dose all drugs  Expressive aphasia New per family at bedside.  Family last saw patient about 2 weeks ago and patient lives at home by herself.   Patient underwent stroke workup and MRI of the brain were negative as well as EEG Patient seen by neurologist and cleared from neurological standpoint   Elevated INR Patient is not on anticoagulation And given elevated INR with unclear last known normal, per neurology, patient is not a candidate for TNK   Essential hypertension Continue metoprolol Holding lisinopril, Lasix and spironolactone on account of AKI   Hypothyroidism Home levothyroxine 50 mcg daily before breakfast resumed   Pyuria Present on admission, in setting of acute kidney injury,  we will also treat as urinary tract infection Continue ceftriaxone 1 g IV daily, 5 days ordered    DVT prophylaxis: Heparin 5000 units subcutaneous every 8 hours  Code Status: Full code  Diet: Renal diet  Family Communication: Discussed with patient's daughter present at bedside  Disposition Plan: Pending clinical course  Consults called: Neurology service  Admission status: Telemetry medical, inpatient   Physical Exam: General: Patient appears confused however responsive ENNT: Atraumatic normocephalic Respiratory: clear to auscultation bilaterally, no wheezing, no crackles. Normal respiratory effort. No accessory muscle use.  Cardiovascular: Regular rate and rhythm, no murmurs / rubs / gallops. No extremity edema. 2+ pedal pulses. No carotid bruits.  Abdomen: Nontender Musculoskeletal: Moving all extremities Neurologic: Awake communicative having symptoms of altered mental status Psychiatric: Normal mood.   Vitals:   11/13/22 2236 11/14/22 0319 11/14/22 0853 11/14/22 1147  BP: 105/64 108/78 124/61 (!) 138/56  Pulse: 86 94 96 86  Resp:  18 18 17   Temp: 98 F (36.7 C) 98.6 F (37 C) 98 F (36.7 C) 98.5 F (36.9 C)  TempSrc: Oral Oral    SpO2: 100% 100% 93% 100%  Weight: 65.4 kg     Height: 5' (1.524 m)       Data Reviewed: I have reviewed patient's CBC, CMP MRI of the brain CT scan of the head neurology documentation as well as nursing documentation  Author: Loyce Dys, MD 11/14/2022 4:19 PM  For on call review [email or web address].

## 2022-11-14 NOTE — Progress Notes (Signed)
Per Dr Meriam Sprague, dc tele monitoring

## 2022-11-14 NOTE — Progress Notes (Signed)
Eeg done 

## 2022-11-14 NOTE — Plan of Care (Signed)
  Problem: Clinical Measurements: Goal: Ability to maintain clinical measurements within normal limits will improve Outcome: Progressing Goal: Will remain free from infection Outcome: Progressing Goal: Diagnostic test results will improve Outcome: Progressing Goal: Respiratory complications will improve Outcome: Progressing Goal: Cardiovascular complication will be avoided Outcome: Progressing   Problem: Skin Integrity: Goal: Risk for impaired skin integrity will decrease Outcome: Progressing   

## 2022-11-15 ENCOUNTER — Inpatient Hospital Stay: Payer: Medicare HMO

## 2022-11-15 ENCOUNTER — Inpatient Hospital Stay
Admit: 2022-11-15 | Discharge: 2022-11-15 | Disposition: A | Discharge status: Home / Self Care | Payer: Medicare HMO | Attending: Internal Medicine | Admitting: Internal Medicine

## 2022-11-15 ENCOUNTER — Inpatient Hospital Stay: Admit: 2022-11-15 | Payer: Medicare HMO

## 2022-11-15 DIAGNOSIS — E875 Hyperkalemia: Secondary | ICD-10-CM | POA: Diagnosis not present

## 2022-11-15 DIAGNOSIS — I10 Essential (primary) hypertension: Secondary | ICD-10-CM

## 2022-11-15 LAB — ECHOCARDIOGRAM COMPLETE
AR max vel: 2.28 cm2
AV Area VTI: 2.54 cm2
AV Area mean vel: 2.15 cm2
AV Mean grad: 2.3 mmHg
AV Peak grad: 3.9 mmHg
Ao pk vel: 0.99 m/s
Area-P 1/2: 5.42 cm2
Height: 60 in
MV M vel: 5.15 m/s
MV Peak grad: 106.1 mmHg
P 1/2 time: 486 ms
Radius: 0.8 cm
S' Lateral: 2.8 cm
Weight: 2306.89 [oz_av]

## 2022-11-15 LAB — BASIC METABOLIC PANEL
Anion gap: 5 (ref 5–15)
BUN: 30 mg/dL — ABNORMAL HIGH (ref 6–20)
BUN: 21 mg/dL — ABNORMAL HIGH (ref 6–20)
CO2: 14 mmol/L — ABNORMAL LOW (ref 22–32)
CO2: 15 mmol/L — ABNORMAL LOW (ref 22–32)
Calcium: 8.6 mg/dL — ABNORMAL LOW (ref 8.9–10.3)
Calcium: 8.4 mg/dL — ABNORMAL LOW (ref 8.9–10.3)
Chloride: >130 mmol/L (ref 98–111)
Chloride: 119 mmol/L — ABNORMAL HIGH (ref 98–111)
Creatinine, Ser: 1.2 mg/dL — ABNORMAL HIGH (ref 0.44–1.00)
Creatinine, Ser: 1.05 mg/dL — ABNORMAL HIGH (ref 0.44–1.00)
GFR, Estimated: 53 mL/min — ABNORMAL LOW (ref >60)
GFR, Estimated: >60 mL/min (ref >60)
Glucose, Bld: 118 mg/dL — ABNORMAL HIGH (ref 70–99)
Glucose, Bld: 87 mg/dL (ref 70–99)
Potassium: 5 mmol/L (ref 3.5–5.1)
Potassium: 4.1 mmol/L (ref 3.5–5.1)
Sodium: 147 mmol/L — ABNORMAL HIGH (ref 135–145)
Sodium: 141 mmol/L (ref 135–145)

## 2022-11-15 LAB — CBC WITH DIFFERENTIAL/PLATELET
Abs Immature Granulocytes: 0.14 10*3/uL — ABNORMAL HIGH (ref 0.00–0.07)
Basophils Absolute: 0.1 10*3/uL (ref 0.0–0.1)
Basophils Relative: 1 %
Eosinophils Absolute: 0 10*3/uL (ref 0.0–0.5)
Eosinophils Relative: 0 %
HCT: 23 % — ABNORMAL LOW (ref 36.0–46.0)
Hemoglobin: 8.3 g/dL — ABNORMAL LOW (ref 12.0–15.0)
Immature Granulocytes: 1 %
Lymphocytes Relative: 14 %
Lymphs Abs: 2.3 10*3/uL (ref 0.7–4.0)
MCH: 32.3 pg (ref 26.0–34.0)
MCHC: 36.1 g/dL — ABNORMAL HIGH (ref 30.0–36.0)
MCV: 89.5 fL (ref 80.0–100.0)
Monocytes Absolute: 1.8 10*3/uL — ABNORMAL HIGH (ref 0.1–1.0)
Monocytes Relative: 11 %
Neutro Abs: 12.4 10*3/uL — ABNORMAL HIGH (ref 1.7–7.7)
Neutrophils Relative %: 73 %
Platelets: 250 10*3/uL (ref 150–400)
RBC: 2.57 MIL/uL — ABNORMAL LOW (ref 3.87–5.11)
RDW: 14.5 % (ref 11.5–15.5)
WBC: 16.8 10*3/uL — ABNORMAL HIGH (ref 4.0–10.5)
nRBC: 0.3 % — ABNORMAL HIGH (ref 0.0–0.2)

## 2022-11-15 LAB — LACTIC ACID, PLASMA: Lactic Acid, Venous: 1.8 mmol/L (ref 0.5–1.9)

## 2022-11-15 LAB — TSH: TSH: 0.709 u[IU]/mL (ref 0.350–4.500)

## 2022-11-15 MED ORDER — SODIUM BICARBONATE 8.4 % IV SOLN
INTRAVENOUS | Status: DC
  Filled 2022-11-15: qty 150
  Filled 2022-11-15 (×2): qty 1000

## 2022-11-15 MED ORDER — DEXTROSE 5 % IV SOLN: INTRAVENOUS | Status: DC

## 2022-11-15 NOTE — Progress Notes (Signed)
*  PRELIMINARY RESULTS* Echocardiogram 2D Echocardiogram has been performed.  Cristela Blue 11/15/2022, 10:44 AM

## 2022-11-15 NOTE — Consult Note (Signed)
PHARMACY CONSULT NOTE - ELECTROLYTES  Pharmacy Consult for Electrolyte Monitoring and Replacement   Recent Labs: Height: 5' (152.4 cm) Weight: 65.4 kg (144 lb 2.9 oz) IBW/kg (Calculated) : 45.5 Estimated Creatinine Clearance: 43.7 mL/min (A) (by C-G formula based on SCr of 1.2 mg/dL (H)). Potassium (mmol/L)  Date Value  11/15/2022 5.0   Calcium (mg/dL)  Date Value  08/65/7846 8.6 (L)   Albumin (g/dL)  Date Value  96/29/5284 3.6   Sodium (mmol/L)  Date Value  11/15/2022 147 (H)    Assessment  Monique Solis is a 57 y.o. female presenting with concerns for altered mental status. PMH significant for COVID, hypothyroid, HTN. Pharmacy has been consulted to monitor electrolytes.  Diet: Aspiration precautions, renal diet, MIVF: Sodium Bicarb @ 75 mL/hr  Pertinent medications:  8/19 Patiromer 16.8/d PTA meds (holding); furosemide 20/d, lisinopril 40/d, Kcl 59mEq/d, Spiro 25/d   Goal of Therapy: Electrolytes WNL  Plan:  Continue to monitor hyperK, HyperNa, HyperCa Check BMP, Phos with AM labs  Thank you for allowing pharmacy to be a part of this patient's care.  Effie Shy, PharmD PGY1 Pharmacy Resident  11/15/2022 12:21 PM

## 2022-11-15 NOTE — Progress Notes (Signed)
PROGRESS NOTE    Monique Solis   HYQ:[id number] DOB: 1965/06/03  DOA: 11/13/2022 Date of Service: 11/15/22 PCP: Monique Fossa, PA-C     Brief Narrative / Hospital Course:  [name] is a 57 year old female with history of COVID infection, no prior CKD, hypothyroid, hypertension, who presents to the emergency department for chief concerns of altered mental status from home. 08/20: in ED, sodium 137, potassium 6.6, chloride 112, bicarb 11, BUN of 119, serum creatinine of 4.02, EGFR of 12, nonfasting blood glucose 70, WBC 9.4, hemoglobin 9.0, platelets of 273.Lactic acid was 1.1.  UA was positive for moderate leukocytes. CT of the head without contrast: Read as no acute intracranial abnormality.  Air-fluid levels in the bilateral sphenoid sinuses which can be seen in setting of acute sinusitis. CT renal stone study:no concerns MRI brain normal. ED treatment: Calcium gluconate 1 g IV once, dextrose 1 amp, insulin 60 unit one-time dose, [place] 16.8 g packet, sodium bicarbonate injection 50 mill equivalents IV one-time dose. Neurology reviewed chart, no apparent neuro cause for symptoms but ok for formal consult if AMS not correcting w/ improved lytes/metab issues.  08/21: hypernatremia and increasing hyperchloremia, IV fluids to bicarb in D5W and monitor BMP closely.    Consultants:  none  Procedures: none      ASSESSMENT & PLAN:   Principal Problem:   Hyperkalemia Active Problems:   AKI (acute kidney injury) (HCC)   Altered mental status   History of COVID-19   Pyuria   Hypothyroidism   Essential hypertension   Elevated INR   Expressive aphasia   Diverticulosis of colon   Pyuria UTI Present on admission, in setting of acute kidney injury, we will also treat as urinary tract infection Ceftriaxone 1 g IV daily, 5 days ordered  Hyperkalemia Status post ED treatment: Calcium gluconate 1 g once, dextrose 50 mL (1 amp) IV one-time dose, sodium bicarbonate 50 mill  equivalent (1 amp), sodium chloride 500 mL bolus, insulin and aspart 6 units IV one-time dose 0.9% NaCl infusion at 125 mL/h, 1 day ordered Trend BMP --> normal K to 5.0 08/21  Hypernatremia  Acidosis / Bicarb loss d/t likely AKI, UTI Likely related to NS infusion w/ fluid restricted diet  Starting bicarb infusion in D5W Trend BMP frequently    Altered mental status likely secondary to current metabolic derangement as well as UTI Stroke ruled out At bedside, patient has left upper and left lower extremity weakness compared to the right Continue correction of electrolytes as well as UTI treatment Monitor neurochecks closely   AKI (acute kidney injury) (HCC) AKI improving Monitor renal function Avoid nephrotoxic medications Renally dose all drugs   Expressive aphasia New per family.  Family last saw patient about 2 weeks ago and patient lives at home by herself.   Patient underwent stroke workup and MRI of the brain were negative as well as EEG Chart reviewed by neurologist - advise address metabolic issues as above, consult if needed    Elevated INR Patient is not on anticoagulation And given elevated INR with unclear last known normal, per neurology, patient is not a candidate for TNK   Essential hypertension Continue metoprolol Holding lisinopril, Lasix and spironolactone on account of AKI   Hypothyroidism Home levothyroxine 50 mcg daily before breakfast resumed Check TSH    Pyuria UTI present on admission  Present on admission, in setting of acute kidney injury, we will also treat as urinary tract infection Continue ceftriaxone 1 g IV daily,  5 days ordered   Leukocytosis - reactive vs UTI (no leukocytosis on admission)  More likely d/t UTI given L shift  Trend CBC   DVT prophylaxis: heparin sq Pertinent IV fluids/nutrition: bicarb in D5W Central lines / invasive devices: none   Code Status: full code ACP documentation reviewed: none on file  Current Admission  Status: inpatient  TOC needs / Dispo plan: TBD Barriers to discharge / significant pending items: correction acidosis              Subjective / Brief ROS:  Patient reports coughing some blood-streaked mucus today, no SOB except w/ exertion  Denies CP Pain controlled.  Denies new weakness.  Tolerating diet.  Reports no concerns w/ urination/defecation.   Family Communication: multiple family members at bedside on rounds     Objective Findings:  Vitals:   11/14/22 2030 11/14/22 2034 11/15/22 0513 11/15/22 0744  BP: 128/66  109/63 106/86  Pulse: 89  69 76  Resp: 18  20 19   Temp: 99.5 F (37.5 C)  98.1 F (36.7 C) 99 F (37.2 C)  TempSrc: Oral   Oral  SpO2: 97% 97% 98% 100%  Weight:      Height:        Intake/Output Summary (Last 24 hours) at 11/15/2022 1506 Last data filed at 11/15/2022 0900 Gross per 24 hour  Intake 240 ml  Output --  Net 240 ml   Filed Weights   11/13/22 0954 11/13/22 2236  Weight: 63 kg 65.4 kg    Examination:  Physical Exam Constitutional:      General: She is not in acute distress. Cardiovascular:     Rate and Rhythm: Normal rate and regular rhythm.     Heart sounds: Normal heart sounds.  Pulmonary:     Effort: Pulmonary effort is normal.     Breath sounds: Normal breath sounds.  Abdominal:     Palpations: Abdomen is soft.  Musculoskeletal:     Right lower leg: No edema.     Left lower leg: No edema.  Neurological:     Mental Status: She is alert.          Scheduled Medications:   budesonide (PULMICORT) nebulizer solution  0.25 mg Nebulization BID   heparin  5,000 Units Subcutaneous Q8H   hydrOXYzine  50 mg Oral QHS   levothyroxine  50 mcg Oral Q0600   metoprolol succinate  50 mg Oral Daily   patiromer  16.8 g Oral Daily   thiamine (VITAMIN B1) injection  100 mg Intravenous Daily    Continuous Infusions:  cefTRIAXone (ROCEPHIN)  IV 1 g (11/14/22 1348)   sodium bicarbonate 150 mEq in dextrose 5 % 1,150 mL  infusion 75 mL/hr at 11/15/22 1047    PRN Medications:  acetaminophen **OR** acetaminophen, albuterol, LORazepam, LORazepam, nicotine, ondansetron **OR** ondansetron (ZOFRAN) IV, senna-docusate  Antimicrobials from admission:  Anti-infectives (From admission, onward)    Start     Dose/Rate Route Frequency Ordered Stop   11/13/22 1330  cefTRIAXone (ROCEPHIN) 1 g in sodium chloride 0.9 % 100 mL IVPB        1 g 200 mL/hr over 30 Minutes Intravenous Every 24 hours 11/13/22 1303 11/18/22 1329           Data Reviewed:  I have personally reviewed the following...  CBC: Recent Labs  Lab 11/13/22 1008 11/14/22 0506 11/15/22 0538  WBC 9.4 10.5 16.8*  NEUTROABS 6.8  --  12.4*  HGB 9.0* 8.4* 8.3*  HCT 26.5*  24.1* 23.0*  MCV 93.0 90.3 89.5  PLT 273 247 250   Basic Metabolic Panel: Recent Labs  Lab 11/13/22 1008 11/13/22 1444 11/14/22 0506 11/15/22 0538  NA 137  --  146* 147*  K 6.6* 4.7 5.2* 5.0  CL 112*  --  126* >130*  CO2 11*  --  11* 14*  GLUCOSE 70  --  139* 118*  BUN 119*  --  66* 30*  CREATININE 4.02*  --  1.47* 1.20*  CALCIUM 8.8*  --  8.4* 8.6*   GFR: Estimated Creatinine Clearance: 43.7 mL/min (A) (by C-G formula based on SCr of 1.2 mg/dL (H)). Liver Function Tests: Recent Labs  Lab 11/13/22 1008  AST 22  ALT 21  ALKPHOS 43  BILITOT 0.9  PROT 8.7*  ALBUMIN 3.6   No results for input(s): "LIPASE", "AMYLASE" in the last 168 hours. No results for input(s): "AMMONIA" in the last 168 hours. Coagulation Profile: Recent Labs  Lab 11/13/22 1008  INR 1.4*   Cardiac Enzymes: No results for input(s): "CKTOTAL", "CKMB", "CKMBINDEX", "TROPONINI" in the last 168 hours. BNP (last 3 results) No results for input(s): "PROBNP" in the last 8760 hours. HbA1C: No results for input(s): "HGBA1C" in the last 72 hours. CBG: Recent Labs  Lab 11/13/22 1145 11/13/22 1214 11/13/22 1336 11/13/22 1437  GLUCAP 69* 229* 68* 88   Lipid Profile: No results for  input(s): "CHOL", "HDL", "LDLCALC", "TRIG", "CHOLHDL", "LDLDIRECT" in the last 72 hours. Thyroid Function Tests: Recent Labs    11/15/22 0538  TSH 0.709   Anemia Panel: No results for input(s): "VITAMINB12", "FOLATE", "FERRITIN", "TIBC", "IRON", "RETICCTPCT" in the last 72 hours. Most Recent Urinalysis On File:     Component Value Date/Time   COLORURINE YELLOW (A) 11/13/2022 1223   APPEARANCEUR HAZY (A) 11/13/2022 1223   LABSPEC 1.016 11/13/2022 1223   PHURINE 5.0 11/13/2022 1223   GLUCOSEU NEGATIVE 11/13/2022 1223   HGBUR NEGATIVE 11/13/2022 1223   BILIRUBINUR NEGATIVE 11/13/2022 1223   KETONESUR NEGATIVE 11/13/2022 1223   PROTEINUR NEGATIVE 11/13/2022 1223   NITRITE NEGATIVE 11/13/2022 1223   LEUKOCYTESUR MODERATE (A) 11/13/2022 1223   Sepsis Labs: @LABRCNTIP (procalcitonin:4,lacticidven:4) Microbiology: Recent Results (from the past 240 hour(s))  Culture, blood (Routine x 2)     Status: None (Preliminary result)   Collection Time: 11/13/22 10:08 AM   Specimen: BLOOD  Result Value Ref Range Status   Specimen Description BLOOD BLOOD RIGHT ARM  Final   Special Requests   Final    BOTTLES DRAWN AEROBIC AND ANAEROBIC Blood Culture adequate volume   Culture   Final    NO GROWTH 2 DAYS Performed at [facility], [address] [place], [place] [address]    Report Status PENDING  Incomplete  Culture, blood (Routine x 2)     Status: None (Preliminary result)   Collection Time: 11/13/22 10:19 AM   Specimen: BLOOD LEFT ARM  Result Value Ref Range Status   Specimen Description BLOOD LEFT ARM  Final   Special Requests   Final    BOTTLES DRAWN AEROBIC AND ANAEROBIC Blood Culture adequate volume   Culture   Final    NO GROWTH 2 DAYS Performed at [facility], [address] [place], [place] [address]    Report Status PENDING  Incomplete      Radiology Studies last 3 days: ECHOCARDIOGRAM COMPLETE  Result Date: 11/15/2022    ECHOCARDIOGRAM REPORT    Patient Name:   Faylynn A Mctighe Date of Exam: 11/15/2022  Medical Rec #:  [id number]      Height:       60.0 in Accession #:    [id number]     Weight:       144.2 lb Date of Birth:  08-Aug-1965       BSA:          1.624 m Patient Age:    57 years       BP:           106/86 mmHg Patient Gender: F              HR:           76 bpm. Exam Location:  [facility] Procedure: 2D Echo, Cardiac Doppler, Color Doppler and Saline Contrast Bubble            Study Indications:     Stroke I63.9  History:         Patient has no prior history of Echocardiogram examinations.  Sonographer:     Cristela Blue Referring Phys:  [id number] AMY N COX Diagnosing Phys: Rozell Searing Custovic IMPRESSIONS  1. Left ventricular ejection fraction, by estimation, is 65 to 70%. The left ventricle has normal function. The left ventricle has no regional wall motion abnormalities. Left ventricular diastolic parameters were normal.  2. Right ventricular systolic function is normal. The right ventricular size is normal.  3. The mitral valve is degenerative. Severe mitral valve regurgitation. No evidence of mitral stenosis.  4. The aortic valve is abnormal. Aortic valve regurgitation is moderate. Aortic valve sclerosis is present, with no evidence of aortic valve stenosis.  5. Agitated saline contrast bubble study was negative, with no evidence of any interatrial shunt. FINDINGS  Left Ventricle: Left ventricular ejection fraction, by estimation, is 65 to 70%. The left ventricle has normal function. The left ventricle has no regional wall motion abnormalities. The left ventricular internal cavity size was normal in size. There is  no left ventricular hypertrophy. Left ventricular diastolic parameters were normal. Right Ventricle: The right ventricular size is normal. No increase in right ventricular wall thickness. Right ventricular systolic function is normal. Left Atrium: Left atrial size was normal in size. Right Atrium: Right atrial size was normal in size. Pericardium: There  is no evidence of pericardial effusion. Mitral Valve: The mitral valve is degenerative in appearance. Severe mitral valve regurgitation, with wall-impinging jet. No evidence of mitral valve stenosis. Tricuspid Valve: The tricuspid valve is grossly normal. Tricuspid valve regurgitation is not demonstrated. Aortic Valve: The aortic valve is abnormal. Aortic valve regurgitation is moderate. Aortic regurgitation PHT measures 486 msec. Aortic valve sclerosis is present, with no evidence of aortic valve stenosis. Aortic valve mean gradient measures 2.3 mmHg. Aortic valve peak gradient measures 3.9 mmHg. Aortic valve area, by VTI measures 2.54 cm. Pulmonic Valve: The pulmonic valve was grossly normal. Pulmonic valve regurgitation is not visualized. Aorta: The aortic root is normal in size and structure. IAS/Shunts: No atrial level shunt detected by color flow Doppler. Agitated saline contrast was given intravenously to evaluate for intracardiac shunting. Agitated saline contrast bubble study was negative, with no evidence of any interatrial shunt.  LEFT VENTRICLE PLAX 2D LVIDd:         4.80 cm   Diastology LVIDs:         2.80 cm   LV e' medial:    11.20 cm/s LV PW:         0.70 cm   LV E/e' medial:  9.6 LV IVS:  0.90 cm   LV e' lateral:   15.70 cm/s LVOT diam:     2.00 cm   LV E/e' lateral: 6.9 LV SV:         52 LV SV Index:   32 LVOT Area:     3.14 cm  RIGHT VENTRICLE RV Basal diam:  3.20 cm RV Mid diam:    2.30 cm LEFT ATRIUM           Index        RIGHT ATRIUM          Index LA diam:      3.50 cm 2.15 cm/m   RA Area:     9.57 cm LA Vol (A2C): 46.7 ml 28.75 ml/m  RA Volume:   16.80 ml 10.34 ml/m  AORTIC VALVE AV Area (Vmax):    2.28 cm AV Area (Vmean):   2.15 cm AV Area (VTI):     2.54 cm AV Vmax:           99.00 cm/s AV Vmean:          66.000 cm/s AV VTI:            0.206 m AV Peak Grad:      3.9 mmHg AV Mean Grad:      2.3 mmHg LVOT Vmax:         71.90 cm/s LVOT Vmean:        45.200 cm/s LVOT VTI:           0.166 m LVOT/AV VTI ratio: 0.81 AI PHT:            486 msec  AORTA Ao Root diam: 3.20 cm MITRAL VALVE                  TRICUSPID VALVE MV Area (PHT): 5.42 cm       TR Peak grad:   44.1 mmHg MV Decel Time: 140 msec       TR Vmax:        332.00 cm/s MR Peak grad:    106.1 mmHg MR Mean grad:    75.0 mmHg    SHUNTS MR Vmax:         515.00 cm/s  Systemic VTI:  0.17 m MR Vmean:        419.0 cm/s   Systemic Diam: 2.00 cm MR PISA:         4.02 cm MR PISA Eff ROA: 30 mm MR PISA Radius:  0.80 cm MV E velocity: 108.00 cm/s MV A velocity: 57.10 cm/s MV E/A ratio:  1.89 Sabina Custovic Electronically signed by Clotilde Dieter Signature Date/Time: 11/15/2022/12:53:22 PM    Final    EEG adult  Result Date: 11/14/2022 Charlsie Quest, MD     11/14/2022 12:03 PM Patient Name: FIDENCIA LURA MRN: [id number] Epilepsy Attending: Charlsie Quest Referring Physician/Provider: Lovenia Kim, DO Date: 11/14/2022 Duration: 30.46 mins Patient history: 57 yo F with altered mental status, new expressive aphasia and left upper and lower extremity weakness. EEG to evaluate for seizure Level of alertness: Awake AEDs during EEG study: None Technical aspects: This EEG study was done with scalp electrodes positioned according to the 10-20 International system of electrode placement. Electrical activity was reviewed with band pass filter of 1-70Hz , sensitivity of 7 uV/mm, display speed of 40mm/sec with a 60Hz  notched filter applied as appropriate. EEG data were recorded continuously and digitally stored.  Video monitoring was available and reviewed as appropriate. Description: EEG showed  continuous generalized 3 to 5 Hz theta-delta slowing.  Hyperventilation and photic stimulation were not performed.  Of note, study was technically difficult due to significant myogenic artifact. ABNORMALITY - Continuous slow, generalized IMPRESSION: This technically difficult study is suggestive of moderate diffuse encephalopathy, nonspecific etiology. No  seizures or epileptiform discharges were seen throughout the recording. Charlsie Quest   MR BRAIN WO CONTRAST  Result Date: 11/13/2022 CLINICAL DATA:  Neuro deficit, acute, stroke suspected. Mental status change. EXAM: MRI HEAD WITHOUT CONTRAST TECHNIQUE: Multiplanar, multiecho pulse sequences of the brain and surrounding structures were obtained without intravenous contrast. COMPARISON:  CT head without contrast 11/13/2022 FINDINGS: Brain: This study is somewhat degraded by patient motion despite sedating medications. No acute infarct, hemorrhage, or mass lesion is present. No significant white matter lesions are present. Deep brain nuclei are within normal limits. The ventricles are of normal size. No significant extraaxial fluid collection is present. The brainstem and cerebellum are within normal limits. The internal auditory canals are within normal limits. Midline structures are within normal limits. Vascular: Flow is present in the major intracranial arteries. Skull and upper cervical spine: The craniocervical junction is normal. Upper cervical spine is within normal limits. Marrow signal is unremarkable. Sinuses/Orbits: Fluid levels are present within the sphenoid sinuses bilaterally. Fluid level is present in the left maxillary sinus. Mild mucosal thickening is present the right maxillary sinus. Scattered mucosal thickening is present throughout the ethmoid air cells bilaterally. Fluid is present inferiorly and posteriorly in the mastoid air cells bilaterally, right greater than left. No obstructing nasopharyngeal lesion is present. A right lens replacement is present. The globes and orbits are otherwise within normal limits. Other: IMPRESSION: 1. Normal MRI appearance of the brain. No acute or focal lesion to explain the patient's symptoms. 2. Moderate diffuse sinus disease. 3. Bilateral mastoid effusions, right greater than left. No obstructing nasopharyngeal lesion is present. Electronically Signed    By: Marin Roberts M.D.   On: 11/13/2022 17:54   CT Renal Stone Study  Result Date: 11/13/2022 CLINICAL DATA:  Abdominal pain, flank pain EXAM: CT ABDOMEN AND PELVIS WITHOUT CONTRAST TECHNIQUE: Multidetector CT imaging of the abdomen and pelvis was performed following the standard protocol without IV contrast. RADIATION DOSE REDUCTION: This exam was performed according to the departmental dose-optimization program which includes automated exposure control, adjustment of the mA and/or kV according to patient size and/or use of iterative reconstruction technique. COMPARISON:  None Available. FINDINGS: Lower chest: No acute abnormality Hepatobiliary: No focal hepatic abnormality. Gallbladder unremarkable. Pancreas: No focal abnormality or ductal dilatation. Spleen: No focal abnormality.  Normal size. Adrenals/Urinary Tract: No adrenal abnormality. No focal renal abnormality. No stones or hydronephrosis. Urinary bladder is unremarkable. Stomach/Bowel: Normal appendix. Stomach, large and small bowel grossly unremarkable. Scattered left colonic diverticula. No active diverticulitis. Vascular/Lymphatic: Aortoiliac atherosclerosis. No evidence of aneurysm or adenopathy. Reproductive: Uterus and adnexa unremarkable.  No mass. Other: No free fluid or free air. Musculoskeletal: No acute bony abnormality. Degenerative disc and facet disease in the lower lumbar spine with slight anterolisthesis of L4 on L5. IMPRESSION: No renal or ureteral stones.  No hydronephrosis. No acute findings. Scattered left colonic diverticulosis.  No active diverticulitis. Aortic atherosclerosis. No acute findings. Electronically Signed   By: Charlett Nose M.D.   On: 11/13/2022 15:11   DG Chest Port 1 View  Result Date: 11/13/2022 CLINICAL DATA:  Sepsis.  Altered mental status. EXAM: PORTABLE CHEST 1 VIEW COMPARISON:  CXR 11/03/22 FINDINGS: No pleural effusion. No pneumothorax. No  focal airspace opacity. Normal cardiac and mediastinal  contours. No radiographically apparent displaced rib fractures. Visualized upper abdomen is unremarkable. IMPRESSION: No focal airspace opacity Electronically Signed   By: Lorenza Cambridge M.D.   On: 11/13/2022 12:20   CT HEAD WO CONTRAST ( )  Result Date: 11/13/2022 CLINICAL DATA:  Altered mental status EXAM: CT HEAD WITHOUT CONTRAST TECHNIQUE: Contiguous axial images were obtained from the base of the skull through the vertex without intravenous contrast. RADIATION DOSE REDUCTION: This exam was performed according to the departmental dose-optimization program which includes automated exposure control, adjustment of the mA and/or kV according to patient size and/or use of iterative reconstruction technique. COMPARISON:  None Available. FINDINGS: Brain: No evidence of acute infarction, hemorrhage, hydrocephalus, extra-axial collection or mass lesion/mass effect. Vascular: No hyperdense vessel or unexpected calcification. Skull: Normal. Negative for fracture or focal lesion. Sinuses/Orbits: No middle ear or mastoid effusion. Air-fluid levels in the bilateral sphenoid sinuses which can be seen in the setting of acute sinusitis. Orbits are unremarkable. Other: None. IMPRESSION: 1. No acute intracranial abnormality. 2. Air-fluid levels in the bilateral sphenoid sinuses which can be seen in the setting of acute sinusitis. Electronically Signed   By: Lorenza Cambridge M.D.   On: 11/13/2022 12:18             LOS: 2 days      Sunnie Nielsen, DO [facility] 11/15/2022, 3:06 PM    Dictation software may have been used to generate the above note. Typos may occur and escape review in typed/dictated notes. Please contact Dr Lyn Hollingshead directly for clarity if needed.  Staff may message me via secure chat in Epic  but this may not receive an immediate response,  please page me for urgent matters!  If 7PM-7AM, please contact night coverage [email or web address]

## 2022-11-15 NOTE — Progress Notes (Signed)
Mobility Specialist - Progress Note   11/15/22 1637  Mobility  Activity Ambulated with assistance in room;Ambulated with assistance in hallway;Dangled on edge of bed  Level of Assistance Contact guard assist, steadying assist  Assistive Device Front wheel walker  Distance Ambulated (ft) 40 ft  Activity Response Tolerated well  $Mobility charge 1 Mobility     Pt lying in bed upon arrival, utilizing RA. Pt agreeable to activity. Completed bed mobility modI with manual assist to bring RLE off bed. Pt participated in seated therex. R side appearing weaker than L in both upper/lower extremities. STS with minG. Pt ambulated just outside doorway with CGA. VC to stay close to RW. During return distance, pt attempted ambulation without AD. Slightly increased unsteadiness but no LOB. Fatigued with activity. Dizzy throughout session, BP checked at end of ambulation---124/67. Pt left EOB at pt request. Alarm not set, family members at bedside agreed to notify nursing when exiting to have alarm turned on. RN and NT notified.    Monique Solis Mobility Specialist 11/15/22, 4:42 PM

## 2022-11-15 NOTE — Consult Note (Signed)
PHARMACY CONSULT NOTE - ELECTROLYTES  Pharmacy Consult for Electrolyte Monitoring and Replacement   Recent Labs: Height: 5' (152.4 cm) Weight: 65.4 kg (144 lb 2.9 oz) IBW/kg (Calculated) : 45.5 Estimated Creatinine Clearance: 43.7 mL/min (A) (by C-G formula based on SCr of 1.2 mg/dL (H)). Potassium (mmol/L)  Date Value  11/15/2022 5.0   Calcium (mg/dL)  Date Value  03/29/7251 8.6 (L)   Albumin (g/dL)  Date Value  66/44/0347 3.6   Sodium (mmol/L)  Date Value  11/15/2022 147 (H)    Assessment  Monique Solis is a 57 y.o. female presenting with concerns for altered mental status. PMH significant for COVID, hypothyroid, HTN. Pharmacy has been consulted to monitor electrolytes.  Diet: Aspiration precautions, renal diet, MIVF: Sodium Bicarb @ 75 mL/hr  Pertinent medications:  Patiromer 16.8/d  Goal of Therapy: Electrolytes WNL  Plan:  Continue to monitor hyperK, HyperNa, HyperCa Check BMP, Phos with AM labs  Thank you for allowing pharmacy to be a part of this patient's care.  Effie Shy, PharmD PGY1 Pharmacy Resident  11/15/2022 11:00 AM

## 2022-11-15 NOTE — Plan of Care (Signed)

## 2022-11-16 DIAGNOSIS — E875 Hyperkalemia: Secondary | ICD-10-CM | POA: Diagnosis not present

## 2022-11-16 LAB — BASIC METABOLIC PANEL
Anion gap: 4 — ABNORMAL LOW (ref 5–15)
BUN: 11 mg/dL (ref 6–20)
CO2: 19 mmol/L — ABNORMAL LOW (ref 22–32)
Calcium: 7.8 mg/dL — ABNORMAL LOW (ref 8.9–10.3)
Chloride: 116 mmol/L — ABNORMAL HIGH (ref 98–111)
Creatinine, Ser: 0.76 mg/dL (ref 0.44–1.00)
GFR, Estimated: >60 mL/min (ref >60)
Glucose, Bld: 86 mg/dL (ref 70–99)
Potassium: 3.5 mmol/L (ref 3.5–5.1)
Sodium: 139 mmol/L (ref 135–145)

## 2022-11-16 LAB — MAGNESIUM: Magnesium: 1.6 mg/dL — ABNORMAL LOW (ref 1.7–2.4)

## 2022-11-16 LAB — PHOSPHORUS: Phosphorus: 1.5 mg/dL — ABNORMAL LOW (ref 2.5–4.6)

## 2022-11-16 MED ORDER — K PHOS MONO-SOD PHOS DI & MONO 155-852-130 MG PO TABS: 500.0000 mg | ORAL_TABLET | Freq: Four times a day (QID) | ORAL | Status: DC

## 2022-11-16 MED ORDER — MAGNESIUM SULFATE 2 GM/50ML IV SOLN
2.0000 g | Freq: Once | INTRAVENOUS | Status: AC
  Administered 2022-11-16: 2 g via INTRAVENOUS
  Filled 2022-11-16: qty 50

## 2022-11-16 MED ORDER — FUROSEMIDE 20 MG PO TABS: ORAL_TABLET | ORAL | 0 refills | Status: AC

## 2022-11-16 MED ORDER — K PHOS MONO-SOD PHOS DI & MONO 155-852-130 MG PO TABS
500.0000 mg | ORAL_TABLET | Freq: Four times a day (QID) | ORAL | Status: DC
  Administered 2022-11-16: 500 mg via ORAL
  Filled 2022-11-16: qty 2

## 2022-11-16 MED ORDER — POTASSIUM CHLORIDE CRYS ER 20 MEQ PO TBCR: 20.0000 meq | EXTENDED_RELEASE_TABLET | Freq: Two times a day (BID) | ORAL | Status: AC

## 2022-11-16 MED ORDER — CEPHALEXIN 500 MG PO CAPS: 500.0000 mg | ORAL_CAPSULE | Freq: Two times a day (BID) | ORAL | 0 refills | Status: AC

## 2022-11-16 NOTE — Plan of Care (Signed)
  Problem: Education: °Goal: Knowledge of General Education information will improve °Description: Including pain rating scale, medication(s)/side effects and non-pharmacologic comfort measures °Outcome: Progressing °  °Problem: Health Behavior/Discharge Planning: °Goal: Ability to manage health-related needs will improve °Outcome: Progressing °  °Problem: Clinical Measurements: °Goal: Ability to maintain clinical measurements within normal limits will improve °Outcome: Progressing °Goal: Will remain free from infection °Outcome: Progressing °Goal: Diagnostic test results will improve °Outcome: Progressing °Goal: Respiratory complications will improve °Outcome: Progressing °Goal: Cardiovascular complication will be avoided °Outcome: Progressing °  °Problem: Coping: °Goal: Level of anxiety will decrease °Outcome: Progressing °  °Problem: Nutrition: °Goal: Adequate nutrition will be maintained °Outcome: Progressing °  °Problem: Elimination: °Goal: Will not experience complications related to bowel motility °Outcome: Progressing °Goal: Will not experience complications related to urinary retention °Outcome: Progressing °  °Problem: Pain Managment: °Goal: General experience of comfort will improve °Outcome: Progressing °  °Problem: Safety: °Goal: Ability to remain free from injury will improve °Outcome: Progressing °  °Problem: Skin Integrity: °Goal: Risk for impaired skin integrity will decrease °Outcome: Progressing °  °

## 2022-11-16 NOTE — Progress Notes (Signed)
AVS discussed and provided to patient. 1 paper prescription provided to patient for lasix. Other meds sent to pharmacy. No complaints at this time.

## 2022-11-16 NOTE — Consult Note (Addendum)
PHARMACY CONSULT NOTE - ELECTROLYTES  Pharmacy Consult for Electrolyte Monitoring and Replacement   Recent Labs: Height: 5' (152.4 cm) Weight: 65.4 kg (144 lb 2.9 oz) IBW/kg (Calculated) : 45.5 Estimated Creatinine Clearance: 65.5 mL/min (by C-G formula based on SCr of 0.76 mg/dL). Potassium (mmol/L)  Date Value  11/16/2022 3.5   Magnesium (mg/dL)  Date Value  16/12/9602 1.6 (L)   Calcium (mg/dL)  Date Value  54/11/8117 7.8 (L)   Albumin (g/dL)  Date Value  14/78/2956 3.6   Phosphorus (mg/dL)  Date Value  21/30/8657 1.5 (L)   Sodium (mmol/L)  Date Value  11/16/2022 139    Assessment  JOSENID RUBENS is a 57 y.o. female presenting with concerns for altered mental status. PMH significant for COVID, hypothyroid, HTN. Found to be hyperkalemic in the ED and treated with Calcium gluconate 1g IV x 1, dextrose 1 amp, insulin 60 units x1, Veltassa 16.8/d, and sodium bicarbonate 50 mEq IV x1. Found to be hypernatremic and hyperchloremic 8/21. NS IV fluids were changed to Sodium Biocarb D5W. Altered mental status secondary to UTI vs metabolic derangement. Pharmacy has been consulted to monitor electrolytes.  Diet: Aspiration precautions, renal diet, MIVF: Sodium Bicarb 131mED5WS @ 75 mL/hr  Pertinent medications:  8/19 Patiromer 16.8/d PTA meds (holding); furosemide 20/d, lisinopril 40/d, Kcl 33mEq/d, Spiro 25/d   Goal of Therapy: Electrolytes WNL K >> 3.5 Ca >> 7.8 corrected 8.1 (albumin 3.6) Mg >> 1.6 Phos >> 1.5  Plan:  Correct HypoMg with 2.0 mg Mg Sulfate IV x 1 Correct HypoPhos with KPhos 2 packets PO x4 Potassium WNL continue to monitor Ca corrected to 8.1; continue to monitor and correct if < 8 with symptoms (fatigue, confusion) or < 7.5 without symptoms Recheck BMP, Phos, Mg with AM labs  Thank you for allowing pharmacy to be a part of this patient's care.  Effie Shy, PharmD PGY1 Pharmacy Resident  11/16/2022 7:35 AM

## 2022-11-16 NOTE — TOC Progression Note (Signed)
Transition of Care [facility]) - Progression Note    Patient Details  Name: Monique Solis MRN: [id number] Date of Birth: Mar 18, 1966  Transition of [facility]) CM/SW Contact  Garret Reddish, RN Phone Number: 11/16/2022, 10:31 AM  Clinical Narrative:   Chart reviewed.  Noted that patient was admitted for Hyperkalemia and Altered Mental Status.    Noted that patient received medical work for Hyperkalemia, Hypernatremia and increasing hyperchloremia.  Patient has PCP and insurance.  Patient has been up out of bed with mobility tech.    No TOC needs identified at this time.           Expected Discharge Plan and Services                                               Social Determinants of Health (SDOH) Interventions SDOH Screenings   Food Insecurity: No Food Insecurity (11/13/2022)  Housing: Low Risk  (11/13/2022)  Transportation Needs: No Transportation Needs (11/13/2022)  Utilities: Not At Risk (11/13/2022)  Social Connections: Unknown (08/09/2021)   Received from [facility]  Tobacco Use: High Risk (11/13/2022)    Readmission Risk Interventions     No data to display

## 2022-11-16 NOTE — Care Management Important Message (Signed)
Important Message  Patient Details  Name: Monique Solis MRN: [id number] Date of Birth: May 18, 1965   Medicare Important Message Given:  N/A - LOS <3 / Initial given by admissions     [name] 11/16/2022, 9:02 AM

## 2022-11-16 NOTE — Discharge Summary (Signed)
Physician Discharge Summary   Patient: Monique Solis MRN: [id number]  DOB: [birth year]   Admit:     Date of Admission: 11/13/2022 Admitted from: home   Discharge: Date of discharge: 11/16/22 Disposition: Home Condition at discharge: good  CODE STATUS: FULL CODE     Discharge Physician: Sunnie Nielsen, DO [facility]     PCP: Marya Fossa, PA-C  Recommendations for Outpatient Follow-up:  Follow up with PCP Marya Fossa, PA-C in 1-2 weeks Please obtain labs/tests: CBC. BMP, BP check in 1-2 weeks Please follow up on the following pending results: none    Discharge Instructions     (HEART FAILURE PATIENTS) Call MD:  Anytime you have any of the following symptoms: 1) 3 pound weight gain in 24 hours or 5 pounds in 1 week 2) shortness of breath, with or without a dry hacking cough 3) swelling in the hands, feet or stomach 4) if you have to sleep on extra pillows at night in order to breathe.   Complete by: As directed    Diet - low sodium heart healthy   Complete by: As directed    Discharge instructions   Complete by: As directed    Your kidney function is looking better. You were very dehydrated and had a urinary infection which caused a drop in your kidney function, and you were also on medicines which can affect kidneys.   We are asking you HOLD some of these medicines, especially since your blood pressure has been good: 1) Please DO NOT TAKE lisinopril or spironolactone for now until your blood work and blood pressure are rechecked by your primary doctor or your cardiologist.  2) Please ONLY TAKE furosemide/Lasix if you experience swelling, trouble breathing, or weight gain and then ONLY take for 3 days and call your cardiologist or seek medical care if the swelling, trouble breathing, and weight gain are not getting better.    We have sent antibiotics to finish treatment for UTI.   Increase activity slowly   Complete by: As directed           Discharge Diagnoses: Principal Problem:   Hyperkalemia Active Problems:   AKI (acute kidney injury) (HCC)   Altered mental status   History of COVID-19   Pyuria   Hypothyroidism   Essential hypertension   Elevated INR   Expressive aphasia   Diverticulosis of colon       Hospital Course: Monique Solis is a 57 year old female with history of COVID infection, no prior CKD, hypothyroid, hypertension, chronic systolic CHF, who presents to the emergency department for chief concerns of altered mental status from home. 08/20: in ED, sodium 137, potassium 6.6, chloride 112, bicarb 11, BUN of 119, serum creatinine of 4.02, EGFR of 12, nonfasting blood glucose 70, WBC 9.4, hemoglobin 9.0, platelets of 273.Lactic acid was 1.1.  UA was positive for moderate leukocytes. CT of the head without contrast: Read as no acute intracranial abnormality.  Air-fluid levels in the bilateral sphenoid sinuses which can be seen in setting of acute sinusitis. CT renal stone study:no concerns MRI brain normal. ED treatment: Calcium gluconate 1 g IV once, dextrose 1 amp, insulin 60 unit one-time dose, Veltassa 16.8 g packet, sodium bicarbonate injection 50 mill equivalents IV one-time dose. Neurology reviewed chart, no apparent neuro cause for symptoms but ok for formal consult if AMS not correcting w/ improved lytes/metab issues.  08/21: hypernatremia and increasing hyperchloremia, IV fluids to bicarb in D5W and monitor BMP closely.  08/22: renal function has corrected and acidosis much improved. Pt feeling well, got IV Rocephin and can d/c home to finish po abx, holding lisinopril/spiro, take lasix only as needed, follow w/ PCP in 1 week for recheck BP and BMP/Mg/Phos, and would also follow w/ cardiology in 2-4 weeks.     Consultants:  none  Procedures: none      ASSESSMENT & PLAN:   Pyuria UTI Present on admission, in setting of acute kidney injury, we will also treat as urinary tract  infection Ceftriaxone 1 g IV daily, 5 days ordered --> got 3 days and d/c on keflex, UCx never collected will not get one now on abx, defer to PCP   Hyperkalemia Status post ED treatment: Calcium gluconate 1 g once, dextrose 50 mL (1 amp) IV one-time dose, sodium bicarbonate 50 mill equivalent (1 amp), sodium chloride 500 mL bolus, insulin and aspart 6 units IV one-time dose 0.9% NaCl infusion at 125 mL/h, 1 day ordered Trend BMP --> normal K to 5.0 08/21  Hypernatremia - resolved Acidosis / Bicarb loss d/t likely AKI, UTI Likely related to NS infusion w/ fluid restricted diet, improved on bicarb D5W Trend BMP outpatient    Altered mental status likely secondary to metabolic encephalopathy related to electrolyte derangements as well as UTI - resolved  Stroke ruled out At bedside, patient has left upper and left lower extremity weakness compared to the right, MRI brain no concerns  Trend BMP and monitor urinary symptoms and mental status outpatient    AKI (acute kidney injury) - resolved  Trend BMP outpatient  Avoid nephrotoxic medications - see med rec, we have held ARB/Spiro, do not take lasix except as needed, d/w PCP / Cardiology    Expressive aphasia - resolved  New per family.  Family last saw patient about 2 weeks ago and patient lives at home by herself.   Patient underwent stroke workup and MRI of the brain were negative as well as EEG Chart reviewed by neurologist - advise address metabolic issues as above, consult if needed - have cancelled consult    Elevated INR Patient is not on anticoagulation And given elevated INR with unclear last known normal, per neurology, patient is not a candidate for TNK Recheck outpatient    Essential hypertension Continue metoprolol Holding lisinopril, Lasix and spironolactone on account of AKI w/ acidosis    Hypothyroidism Home levothyroxine 50 mcg daily before breakfast resumed Check TSH --> WNL    Leukocytosis - reactive vs UTI (no  leukocytosis on admission)  More likely d/t UTI given L shift  Trend CBC         Discharge Instructions  Allergies as of 11/16/2022   No Known Allergies      Medication List     STOP taking these medications    lisinopril 40 MG tablet Commonly known as: ZESTRIL   naproxen 500 MG EC tablet Commonly known as: EC NAPROSYN   spironolactone 25 MG tablet Commonly known as: ALDACTONE       TAKE these medications    Arnuity Ellipta 100 MCG/ACT Aepb Generic drug: Fluticasone Furoate Inhale 1 puff into the lungs daily.   cephALEXin 500 MG capsule Commonly known as: KEFLEX Take 1 capsule (500 mg total) by mouth 2 (two) times daily for 4 days. Start taking on: November 17, 2022   fluticasone 110 MCG/ACT inhaler Commonly known as: FLOVENT HFA Inhale 2 puffs into the lungs 2 (two) times daily.   furosemide 20 MG tablet Commonly  known as: Lasix Take to 1 tablet (20 mg total) by mouth TWICE daily (total daily dose 40 mg) as needed for up to 3 days for increased leg swelling, shortness of breath, weight gain 5+ lbs over 1-2 days. Seek medical care if these symptoms are not improving with increased dose. What changed:  how much to take how to take this when to take this additional instructions   hydrOXYzine 50 MG tablet Commonly known as: ATARAX Take 50 mg by mouth at bedtime.   levothyroxine 50 MCG tablet Commonly known as: SYNTHROID Take 50 mcg by mouth daily before breakfast.   metoprolol succinate 50 MG 24 hr tablet Commonly known as: TOPROL-XL Take 50 mg by mouth daily. Take with or immediately following a meal.   potassium chloride SA 20 MEQ tablet Commonly known as: KLOR-CON M Take 1 tablet (20 mEq total) by mouth 2 (two) times daily. On days you take furosemide (Lasix) What changed: additional instructions   Ventolin HFA 108 (90 Base) MCG/ACT inhaler Generic drug: albuterol Inhale into the lungs.          No Known Allergies   Subjective: no  concerns this morning, feeling good and is asking about going home    Discharge Exam: BP (!) 95/55 (BP Location: Right Arm)   Pulse 74   Temp (!) 97 F (36.1 C)   Resp 18   Ht 5' (1.524 m)   Wt 65.4 kg   SpO2 100%   BMI 28.16 kg/m  General: Pt is alert, awake, not in acute distress Cardiovascular: RRR, S1/S2 +, no rubs, no gallops Respiratory: CTA bilaterally, no wheezing, no rhonchi Abdominal: Soft, NT, ND, bowel sounds WNL Extremities: no edema, no cyanosis     The results of significant diagnostics from this hospitalization (including imaging, microbiology, ancillary and laboratory) are listed below for reference.     Microbiology: Recent Results (from the past 240 hour(s))  Culture, blood (Routine x 2)     Status: None (Preliminary result)   Collection Time: 11/13/22 10:08 AM   Specimen: BLOOD  Result Value Ref Range Status   Specimen Description BLOOD BLOOD RIGHT ARM  Final   Special Requests   Final    BOTTLES DRAWN AEROBIC AND ANAEROBIC Blood Culture adequate volume   Culture   Final    NO GROWTH 3 DAYS Performed at [facility], [address], [place], [place] [address]    Report Status PENDING  Incomplete  Culture, blood (Routine x 2)     Status: None (Preliminary result)   Collection Time: 11/13/22 10:19 AM   Specimen: BLOOD LEFT ARM  Result Value Ref Range Status   Specimen Description BLOOD LEFT ARM  Final   Special Requests   Final    BOTTLES DRAWN AEROBIC AND ANAEROBIC Blood Culture adequate volume   Culture   Final    NO GROWTH 3 DAYS Performed at [facility], [address] [place], [place] [address]    Report Status PENDING  Incomplete     Labs: BNP (last 3 results) No results for input(s): "BNP" in the last 8760 hours. Basic Metabolic Panel: Recent Labs  Lab 11/13/22 1008 11/13/22 1444 11/14/22 0506 11/15/22 0538 11/15/22 1513 11/16/22 0342  NA 137  --  146* 147* 141 139  K 6.6* 4.7 5.2* 5.0 4.1 3.5  CL 112*   --  126* >130* 119* 116*  CO2 11*  --  11* 14* 15* 19*  GLUCOSE 70  --  139*  118* 87 86  BUN 119*  --  66* 30* 21* 11  CREATININE 4.02*  --  1.47* 1.20* 1.05* 0.76  CALCIUM 8.8*  --  8.4* 8.6* 8.4* 7.8*  MG  --   --   --   --   --  1.6*  PHOS  --   --   --   --   --  1.5*   Liver Function Tests: Recent Labs  Lab 11/13/22 1008  AST 22  ALT 21  ALKPHOS 43  BILITOT 0.9  PROT 8.7*  ALBUMIN 3.6   No results for input(s): "LIPASE", "AMYLASE" in the last 168 hours. No results for input(s): "AMMONIA" in the last 168 hours. CBC: Recent Labs  Lab 11/13/22 1008 11/14/22 0506 11/15/22 0538  WBC 9.4 10.5 16.8*  NEUTROABS 6.8  --  12.4*  HGB 9.0* 8.4* 8.3*  HCT 26.5* 24.1* 23.0*  MCV 93.0 90.3 89.5  PLT 273 247 250   Cardiac Enzymes: No results for input(s): "CKTOTAL", "CKMB", "CKMBINDEX", "TROPONINI" in the last 168 hours. BNP: Invalid input(s): "POCBNP" CBG: Recent Labs  Lab 11/13/22 1145 11/13/22 1214 11/13/22 1336 11/13/22 1437  GLUCAP 69* 229* 68* 88   D-Dimer No results for input(s): "DDIMER" in the last 72 hours. Hgb A1c No results for input(s): "HGBA1C" in the last 72 hours. Lipid Profile No results for input(s): "CHOL", "HDL", "LDLCALC", "TRIG", "CHOLHDL", "LDLDIRECT" in the last 72 hours. Thyroid function studies Recent Labs    11/15/22 0538  TSH 0.709   Anemia work up No results for input(s): "VITAMINB12", "FOLATE", "FERRITIN", "TIBC", "IRON", "RETICCTPCT" in the last 72 hours. Urinalysis    Component Value Date/Time   COLORURINE YELLOW (A) 11/13/2022 1223   APPEARANCEUR HAZY (A) 11/13/2022 1223   LABSPEC 1.016 11/13/2022 1223   PHURINE 5.0 11/13/2022 1223   GLUCOSEU NEGATIVE 11/13/2022 1223   HGBUR NEGATIVE 11/13/2022 1223   BILIRUBINUR NEGATIVE 11/13/2022 1223   KETONESUR NEGATIVE 11/13/2022 1223   PROTEINUR NEGATIVE 11/13/2022 1223   NITRITE NEGATIVE 11/13/2022 1223   LEUKOCYTESUR MODERATE (A) 11/13/2022 1223   Sepsis Labs Recent Labs   Lab 11/13/22 1008 11/14/22 0506 11/15/22 0538  WBC 9.4 10.5 16.8*   Microbiology Recent Results (from the past 240 hour(s))  Culture, blood (Routine x 2)     Status: None (Preliminary result)   Collection Time: 11/13/22 10:08 AM   Specimen: BLOOD  Result Value Ref Range Status   Specimen Description BLOOD BLOOD RIGHT ARM  Final   Special Requests   Final    BOTTLES DRAWN AEROBIC AND ANAEROBIC Blood Culture adequate volume   Culture   Final    NO GROWTH 3 DAYS Performed at [facility], [address] [place], [place] [address]    Report Status PENDING  Incomplete  Culture, blood (Routine x 2)     Status: None (Preliminary result)   Collection Time: 11/13/22 10:19 AM   Specimen: BLOOD LEFT ARM  Result Value Ref Range Status   Specimen Description BLOOD LEFT ARM  Final   Special Requests   Final    BOTTLES DRAWN AEROBIC AND ANAEROBIC Blood Culture adequate volume   Culture   Final    NO GROWTH 3 DAYS Performed at [facility], [address] [place], [place] [address]    Report Status PENDING  Incomplete   Imaging DG Chest Port 1 View  Result Date: 11/15/2022 CLINICAL DATA:  Persistent cough. Recent COVID diagnosis. Altered mental status EXAM: PORTABLE CHEST 1 VIEW  COMPARISON:  11/03/2022 FINDINGS: Normal cardiac silhouette. Increased hazy density in the RIGHT lung. Small amount fluid along the minor fissure. LEFT lung clear. IMPRESSION: Increased airspace density in the RIGHT lung suggesting edema or infection Electronically Signed   By: Genevive Bi M.D.   On: 11/15/2022 16:54   ECHOCARDIOGRAM COMPLETE  Result Date: 11/15/2022    ECHOCARDIOGRAM REPORT   Patient Name:   LAQUITHA DEMARTINO Date of Exam: 11/15/2022 Medical Rec #:  [id number]      Height:       60.0 in Accession #:    [id number]     Weight:       144.2 lb Date of Birth:  Aug 08, 1965       BSA:          1.624 m Patient Age:    57 years       BP:           106/86 mmHg Patient Gender: F               HR:           76 bpm. Exam Location:  [facility] Procedure: 2D Echo, Cardiac Doppler, Color Doppler and Saline Contrast Bubble            Study Indications:     Stroke I63.9  History:         Patient has no prior history of Echocardiogram examinations.  Sonographer:     Cristela Blue Referring Phys:  [id number] AMY N COX Diagnosing Phys: Rozell Searing Custovic IMPRESSIONS  1. Left ventricular ejection fraction, by estimation, is 65 to 70%. The left ventricle has normal function. The left ventricle has no regional wall motion abnormalities. Left ventricular diastolic parameters were normal.  2. Right ventricular systolic function is normal. The right ventricular size is normal.  3. The mitral valve is degenerative. Severe mitral valve regurgitation. No evidence of mitral stenosis.  4. The aortic valve is abnormal. Aortic valve regurgitation is moderate. Aortic valve sclerosis is present, with no evidence of aortic valve stenosis.  5. Agitated saline contrast bubble study was negative, with no evidence of any interatrial shunt. FINDINGS  Left Ventricle: Left ventricular ejection fraction, by estimation, is 65 to 70%. The left ventricle has normal function. The left ventricle has no regional wall motion abnormalities. The left ventricular internal cavity size was normal in size. There is  no left ventricular hypertrophy. Left ventricular diastolic parameters were normal. Right Ventricle: The right ventricular size is normal. No increase in right ventricular wall thickness. Right ventricular systolic function is normal. Left Atrium: Left atrial size was normal in size. Right Atrium: Right atrial size was normal in size. Pericardium: There is no evidence of pericardial effusion. Mitral Valve: The mitral valve is degenerative in appearance. Severe mitral valve regurgitation, with wall-impinging jet. No evidence of mitral valve stenosis. Tricuspid Valve: The tricuspid valve is grossly normal. Tricuspid valve regurgitation is not  demonstrated. Aortic Valve: The aortic valve is abnormal. Aortic valve regurgitation is moderate. Aortic regurgitation PHT measures 486 msec. Aortic valve sclerosis is present, with no evidence of aortic valve stenosis. Aortic valve mean gradient measures 2.3 mmHg. Aortic valve peak gradient measures 3.9 mmHg. Aortic valve area, by VTI measures 2.54 cm. Pulmonic Valve: The pulmonic valve was grossly normal. Pulmonic valve regurgitation is not visualized. Aorta: The aortic root is normal in size and structure. IAS/Shunts: No atrial level shunt detected by color flow Doppler. Agitated saline contrast was given intravenously to evaluate for intracardiac  shunting. Agitated saline contrast bubble study was negative, with no evidence of any interatrial shunt.  LEFT VENTRICLE PLAX 2D LVIDd:         4.80 cm   Diastology LVIDs:         2.80 cm   LV e' medial:    11.20 cm/s LV PW:         0.70 cm   LV E/e' medial:  9.6 LV IVS:        0.90 cm   LV e' lateral:   15.70 cm/s LVOT diam:     2.00 cm   LV E/e' lateral: 6.9 LV SV:         52 LV SV Index:   32 LVOT Area:     3.14 cm  RIGHT VENTRICLE RV Basal diam:  3.20 cm RV Mid diam:    2.30 cm LEFT ATRIUM           Index        RIGHT ATRIUM          Index LA diam:      3.50 cm 2.15 cm/m   RA Area:     9.57 cm LA Vol (A2C): 46.7 ml 28.75 ml/m  RA Volume:   16.80 ml 10.34 ml/m  AORTIC VALVE AV Area (Vmax):    2.28 cm AV Area (Vmean):   2.15 cm AV Area (VTI):     2.54 cm AV Vmax:           99.00 cm/s AV Vmean:          66.000 cm/s AV VTI:            0.206 m AV Peak Grad:      3.9 mmHg AV Mean Grad:      2.3 mmHg LVOT Vmax:         71.90 cm/s LVOT Vmean:        45.200 cm/s LVOT VTI:          0.166 m LVOT/AV VTI ratio: 0.81 AI PHT:            486 msec  AORTA Ao Root diam: 3.20 cm MITRAL VALVE                  TRICUSPID VALVE MV Area (PHT): 5.42 cm       TR Peak grad:   44.1 mmHg MV Decel Time: 140 msec       TR Vmax:        332.00 cm/s MR Peak grad:    106.1 mmHg MR Mean grad:     75.0 mmHg    SHUNTS MR Vmax:         515.00 cm/s  Systemic VTI:  0.17 m MR Vmean:        419.0 cm/s   Systemic Diam: 2.00 cm MR PISA:         4.02 cm MR PISA Eff ROA: 30 mm MR PISA Radius:  0.80 cm MV E velocity: 108.00 cm/s MV A velocity: 57.10 cm/s MV E/A ratio:  1.89 Designer, multimedia signed by Clotilde Dieter Signature Date/Time: 11/15/2022/12:53:22 PM    Final       Time coordinating discharge: over 30 minutes  SIGNED:  Sunnie Nielsen DO [facility]

## 2022-11-16 NOTE — Plan of Care (Signed)

## 2022-11-18 LAB — CULTURE, BLOOD (ROUTINE X 2)
Culture: NO GROWTH
Culture: NO GROWTH
Special Requests: ADEQUATE
Special Requests: ADEQUATE

## 2023-08-03 ENCOUNTER — Encounter: Payer: Self-pay | Admitting: Cardiology

## 2023-08-03 ENCOUNTER — Ambulatory Visit: Payer: Medicare HMO | Attending: Cardiology | Admitting: Cardiology

## 2023-08-03 VITALS — BP 140/70 | HR 54 | Ht 60.0 in | Wt 144.8 lb

## 2023-08-03 DIAGNOSIS — I5020 Unspecified systolic (congestive) heart failure: Secondary | ICD-10-CM | POA: Diagnosis not present

## 2023-08-03 DIAGNOSIS — F17200 Nicotine dependence, unspecified, uncomplicated: Secondary | ICD-10-CM | POA: Diagnosis not present

## 2023-08-03 DIAGNOSIS — I340 Nonrheumatic mitral (valve) insufficiency: Secondary | ICD-10-CM | POA: Diagnosis not present

## 2023-08-03 DIAGNOSIS — I10 Essential (primary) hypertension: Secondary | ICD-10-CM | POA: Diagnosis not present

## 2023-08-03 MED ORDER — LISINOPRIL 20 MG PO TABS: 10.0000 mg | ORAL_TABLET | Freq: Every day | ORAL | 3 refills | Status: DC

## 2023-08-03 NOTE — Progress Notes (Signed)
 Cardiology Office Note:    Date:  08/03/2023   ID:  Monique Solis, DOB [birth year], MRN [id number]  PCP:  Deborah Falling, Kirby Peoples   [facility] Providers Cardiologist:  None     Referring MD: Anita Barman, PA-C   Chief Complaint  Patient presents with   New Patient (Initial Visit)    Bradycardia referral per PCP. Pt states HR was in 20's    History of Present Illness:    Monique Solis is a 58 y.o. female with a hx of HFrEF (EF 30 to 35% in 2022, recovered on echo 2023 EF 55%.  Last echo 8/24 EF 65%), hypertension, diabetes, current smoker x 30 years who presents to establish care.  Previously seen at [facility] from a cardiac perspective.  Diagnosed with HFrEF back in 2022 with echocardiogram showed EF 30 to 35%.  Lexiscan Myoview 05/2020 showed no ischemia.  Follow-up echo 09/2021 EF 55%.  A home health nurse recently noted her heart rate to be low in the 30s.  She followed up with primary care physician who reduce Toprol -XL to 25 mg daily.  She does not check her BP at home frequently.  She still smokes.  Takes Lasix  20 mg daily.  GDMT includes Toprol -XL 50 mg daily, lisinopril, spironolactone 25.  Most recent echo 10/2022 EF 60 to 65%, moderate mitral regurgitation, mild to moderate AR,  Prior notes/testing Echo 10/2022 EF 65 to 70%, severe MR. Lexiscan Myoview 05/2020 with no ischemia. Echo 05/2020 EF 30 to 35%  Past Medical History:  Diagnosis Date   Diabetes mellitus without complication (HCC)    insulin  dependent   Hypertension    Thyroid disease    Graves disease   Tobacco use     Past Surgical History:  Procedure Laterality Date   TUBAL LIGATION      Current Medications: Current Meds  Medication Sig   spironolactone (ALDACTONE) 25 MG tablet Take 25 mg by mouth daily.   [DISCONTINUED] lisinopril (ZESTRIL) 10 MG tablet Take 10 mg by mouth daily.     Allergies:   Patient has no known allergies.   Social History   Socioeconomic History   Marital status:  Married    Spouse name: Not on file   Number of children: Not on file   Years of education: Not on file   Highest education level: Not on file  Occupational History   Not on file  Tobacco Use   Smoking status: Every Day   Smokeless tobacco: Never  Substance and Sexual Activity   Alcohol use: Yes   Drug use: No   Sexual activity: Yes  Other Topics Concern   Not on file  Social History Narrative   Not on file   Social Drivers of Health   Financial Resource Strain: Low Risk  (12/01/2022)   Received from [facility]   Overall Financial Resource Strain (CARDIA)    Difficulty of Paying Living Expenses: Not hard at all  Food Insecurity: No Food Insecurity (12/01/2022)   Received from [facility]   Hunger Vital Sign    Worried About Running Out of Food in the Last Year: Never true    Ran Out of Food in the Last Year: Never true  Transportation Needs: No Transportation Needs (12/01/2022)   Received from [facility]   PRAPARE - Transportation    Lack of Transportation (Medical): No    Lack of Transportation (Non-Medical): No  Physical Activity: Not on file  Stress: Not  on file  Social Connections: Unknown (08/09/2021)   Received from [facility]   Social Network    Social Network: Not on file     Family History: The patient's family history includes Hypertension in her mother and sister; Stroke in her mother.  ROS:   Please see the history of present illness.     All other systems reviewed and are negative.  EKGs/Labs/Other Studies Reviewed:    The following studies were reviewed today:  EKG Interpretation Date/Time:  Friday Aug 03 2023 08:40:30 EDT Ventricular Rate:  54 PR Interval:  180 QRS Duration:  132 QT Interval:  464 QTC Calculation: 440 R Axis:   8  Text Interpretation: Sinus bradycardia Left bundle branch block Confirmed by Constancia Delton ([id number]) on 08/03/2023 9:00:47 AM    Recent Labs: 11/13/2022: ALT 21 11/15/2022: Hemoglobin  8.3; Platelets 250; TSH 0.709 11/16/2022: BUN 11; Creatinine, Ser 0.76; Magnesium  1.6; Potassium 3.5; Sodium 139  Recent Lipid Panel No results found for: "CHOL", "TRIG", "HDL", "CHOLHDL", "VLDL", "LDLCALC", "LDLDIRECT"   Risk Assessment/Calculations:         Physical Exam:    VS:  BP (!) 140/70 (BP Location: Left Arm, Patient Position: Sitting, Cuff Size: Normal)   Pulse (!) 54   Ht 5' (1.524 m)   Wt 144 lb 12.8 oz (65.7 kg)   SpO2 99%   BMI 28.28 kg/m     Wt Readings from Last 3 Encounters:  08/03/23 144 lb 12.8 oz (65.7 kg)  11/13/22 144 lb 2.9 oz (65.4 kg)  11/03/22 140 lb (63.5 kg)     GEN:  Well nourished, well developed in no acute distress HEENT: Normal NECK: No JVD; No carotid bruits CARDIAC: RRR, 2/6 systolic murmur RESPIRATORY:  Clear to auscultation without rales, wheezing or rhonchi  ABDOMEN: Soft, non-tender, non-distended MUSCULOSKELETAL:  No edema; No deformity  SKIN: Warm and dry NEUROLOGIC:  Alert and oriented x 3 PSYCHIATRIC:  Normal affect   ASSESSMENT:    1. HFrEF (heart failure with reduced ejection fraction) (HCC)   2. Primary hypertension   3. Mitral valve insufficiency, unspecified etiology   4. Current smoker    PLAN:    In order of problems listed above:  HFrEF, initial EF 30 to 35% in 2022, now normalized EF echo 8/24 EF 60 to 65%.  Appears euvolemic.  Heart rates improved with reducing Toprol -XL.  Continue Toprol -XL 25 mg daily, Aldactone 25 mg daily, increase lisinopril to 20 mg daily. Hypertension, BP elevated, increase lisinopril to 20 mg daily.  Advised to check BP at home and keep a log. Moderate mitral regurgitation on previous echo.  Repeat echo in 3 months, this will be 1 year from prior echo. Current smoker, smoking cessation advised.  Follow-up in 3 to 4 months     Medication Adjustments/Labs and Tests Ordered: Current medicines are reviewed at length with the patient today.  Concerns regarding medicines are outlined  above.  Orders Placed This Encounter  Procedures   EKG 12-Lead   ECHOCARDIOGRAM COMPLETE   Meds ordered this encounter  Medications   lisinopril (ZESTRIL) 20 MG tablet    Sig: Take 0.5 tablets (10 mg total) by mouth daily.    Dispense:  90 tablet    Refill:  3    Patient Instructions  Medication Instructions:  Your physician recommends the following medication changes.  INCREASE: Lisinopril to 20 mg daily    *If you need a refill on your cardiac medications before your next appointment,  please call your pharmacy*  Lab Work: No labs ordered today  If you have labs (blood work) drawn today and your tests are completely normal, you will receive your results only by: MyChart Message (if you have MyChart) OR A paper copy in the mail If you have any lab test that is abnormal or we need to change your treatment, we will call you to review the results.  Testing/Procedures: Your physician has requested that you have an echocardiogram in 3 months or around 8/9. Echocardiography is a painless test that uses sound waves to create images of your heart. It provides your doctor with information about the size and shape of your heart and how well your heart's chambers and valves are working.   You may receive an ultrasound enhancing agent through an IV if needed to better visualize your heart during the echo. This procedure takes approximately one hour.  There are no restrictions for this procedure.  This will take place at [address] [facility] [address]) [address], [place] [address]  Please note: We ask at that you not bring children with you during ultrasound (echo/ vascular) testing. Due to room size and safety concerns, children are not allowed in the ultrasound rooms during exams. Our front office staff cannot provide observation of children in our lobby area while testing is being conducted. An adult accompanying a patient to their appointment will only be allowed in the ultrasound  room at the discretion of the ultrasound technician under special circumstances. We apologize for any inconvenience.   Follow-Up: At [facility], you and your health needs are our priority.  As part of our continuing mission to provide you with exceptional heart care, our providers are all part of one team.  This team includes your primary Cardiologist (physician) and Advanced Practice Providers or APPs (Physician Assistants and Nurse Practitioners) who all work together to provide you with the care you need, when you need it.  Your next appointment:   4 month(s)  Provider:   You may see Dr. Junnie Olives or one of the following Advanced Practice Providers on your designated Care Team:   Laneta Pintos, NP Gildardo Labrador, PA-C Varney Gentleman, PA-C Cadence Goodwin, PA-C Ronald Cockayne, NP Morey Ar, NP    We recommend signing up for the patient portal called "MyChart".  Sign up information is provided on this After Visit Summary.  MyChart is used to connect with patients for Virtual Visits (Telemedicine).  Patients are able to view lab/test results, encounter notes, upcoming appointments, etc.  Non-urgent messages can be sent to your provider as well.   To learn more about what you can do with MyChart, go to [email or web address].      Signed, Constancia Delton, MD  08/03/2023 10:01 AM     [facility]

## 2023-08-03 NOTE — Patient Instructions (Signed)
 Medication Instructions:  Your physician recommends the following medication changes.  INCREASE: Lisinopril to 20 mg daily    *If you need a refill on your cardiac medications before your next appointment, please call your pharmacy*  Lab Work: No labs ordered today  If you have labs (blood work) drawn today and your tests are completely normal, you will receive your results only by: MyChart Message (if you have MyChart) OR A paper copy in the mail If you have any lab test that is abnormal or we need to change your treatment, we will call you to review the results.  Testing/Procedures: Your physician has requested that you have an echocardiogram in 3 months or around 8/9. Echocardiography is a painless test that uses sound waves to create images of your heart. It provides your doctor with information about the size and shape of your heart and how well your heart's chambers and valves are working.   You may receive an ultrasound enhancing agent through an IV if needed to better visualize your heart during the echo. This procedure takes approximately one hour.  There are no restrictions for this procedure.  This will take place at [address] [facility] [address]) [address], [place] [address]  Please note: We ask at that you not bring children with you during ultrasound (echo/ vascular) testing. Due to room size and safety concerns, children are not allowed in the ultrasound rooms during exams. Our front office staff cannot provide observation of children in our lobby area while testing is being conducted. An adult accompanying a patient to their appointment will only be allowed in the ultrasound room at the discretion of the ultrasound technician under special circumstances. We apologize for any inconvenience.   Follow-Up: At [facility], you and your health needs are our priority.  As part of our continuing mission to provide you with exceptional heart care, our providers are  all part of one team.  This team includes your primary Cardiologist (physician) and Advanced Practice Providers or APPs (Physician Assistants and Nurse Practitioners) who all work together to provide you with the care you need, when you need it.  Your next appointment:   4 month(s)  Provider:   You may see Dr. Junnie Olives or one of the following Advanced Practice Providers on your designated Care Team:   Laneta Pintos, NP Gildardo Labrador, PA-C Varney Gentleman, PA-C Cadence [place], PA-C Ronald Cockayne, NP Morey Ar, NP    We recommend signing up for the patient portal called "MyChart".  Sign up information is provided on this After Visit Summary.  MyChart is used to connect with patients for Virtual Visits (Telemedicine).  Patients are able to view lab/test results, encounter notes, upcoming appointments, etc.  Non-urgent messages can be sent to your provider as well.   To learn more about what you can do with MyChart, go to [email or web address].

## 2023-11-06 ENCOUNTER — Ambulatory Visit: Attending: Cardiology

## 2023-11-06 DIAGNOSIS — I340 Nonrheumatic mitral (valve) insufficiency: Secondary | ICD-10-CM

## 2023-11-06 LAB — ECHOCARDIOGRAM COMPLETE
AR max vel: 1.97 cm2
AV Area VTI: 1.82 cm2
AV Area mean vel: 2.05 cm2
AV Mean grad: 4 mmHg
AV Peak grad: 7.4 mmHg
Ao pk vel: 1.36 m/s
Area-P 1/2: 5.02 cm2
S' Lateral: 3.49 cm

## 2023-11-07 ENCOUNTER — Ambulatory Visit: Payer: Self-pay | Admitting: Cardiology

## 2023-12-03 ENCOUNTER — Other Ambulatory Visit: Payer: Self-pay | Admitting: Physician Assistant

## 2023-12-03 DIAGNOSIS — Z1231 Encounter for screening mammogram for malignant neoplasm of breast: Secondary | ICD-10-CM

## 2023-12-07 ENCOUNTER — Ambulatory Visit: Attending: Cardiology | Admitting: Cardiology

## 2023-12-07 VITALS — BP 110/54 | HR 56 | Ht 60.0 in | Wt 142.2 lb

## 2023-12-07 DIAGNOSIS — I351 Nonrheumatic aortic (valve) insufficiency: Secondary | ICD-10-CM

## 2023-12-07 DIAGNOSIS — I10 Essential (primary) hypertension: Secondary | ICD-10-CM

## 2023-12-07 DIAGNOSIS — I5020 Unspecified systolic (congestive) heart failure: Secondary | ICD-10-CM | POA: Diagnosis not present

## 2023-12-07 DIAGNOSIS — F17200 Nicotine dependence, unspecified, uncomplicated: Secondary | ICD-10-CM | POA: Diagnosis not present

## 2023-12-07 NOTE — Patient Instructions (Signed)
 Medication Instructions:  Your physician recommends that you continue on your current medications as directed. Please refer to the Current Medication list given to you today.   *If you need a refill on your cardiac medications before your next appointment, please call your pharmacy*  Lab Work: No labs ordered today  If you have labs (blood work) drawn today and your tests are completely normal, you will receive your results only by: MyChart Message (if you have MyChart) OR A paper copy in the mail If you have any lab test that is abnormal or we need to change your treatment, we will call you to review the results.  Testing/Procedures: No test ordered today   Follow-Up: At [facility], you and your health needs are our priority.  As part of our continuing mission to provide you with exceptional heart care, our providers are all part of one team.  This team includes your primary Cardiologist (physician) and Advanced Practice Providers or APPs (Physician Assistants and Nurse Practitioners) who all work together to provide you with the care you need, when you need it.  Your next appointment:   1 year(s)  Provider:   You may see Dr. Darliss or one of the following Advanced Practice Providers on your designated Care Team:   Lonni Meager, NP Lesley Maffucci, PA-C Bernardino Bring, PA-C Cadence [place], PA-C Tylene Lunch, NP Barnie Hila, NP    We recommend signing up for the patient portal called MyChart.  Sign up information is provided on this After Visit Summary.  MyChart is used to connect with patients for Virtual Visits (Telemedicine).  Patients are able to view lab/test results, encounter notes, upcoming appointments, etc.  Non-urgent messages can be sent to your provider as well.   To learn more about what you can do with MyChart, go to [email or web address].   Other Instructions

## 2023-12-07 NOTE — Progress Notes (Signed)
 Cardiology Office Note:    Date:  12/07/2023   ID:  Monique Solis, DOB [birth year], MRN [id number]  PCP:  Norvell Boyer, PA-C   [facility] Providers Cardiologist:  None     Referring MD: Norvell Boyer, PA-C   Chief Complaint  Patient presents with   no issues    History of Present Illness:    Monique Solis is a 58 y.o. female with a hx of HFrEF (EF 30 to 35% in 2022, recovered on echo 2023 EF 55%.  Last echo 8/25 with normalized EF 55-60%), hypertension, diabetes, current smoker x 30 years who presents for follow-up.    Previously seen for hypertension, moderate aortic regurgitation.  Echocardiogram was obtained to evaluate any progression of valvular dysfunction.  Lisinopril  increased to 20 mg daily for adequate BP control.  She still smokes.   Prior notes/testing Echo 10/2022 EF 65 to 70%, moderate MR. Lexiscan Myoview 05/2020 with no ischemia. Echo 05/2020 EF 30 to 35%  Past Medical History:  Diagnosis Date   Diabetes mellitus without complication (HCC)    insulin  dependent   Hypertension    Thyroid disease    Graves disease   Tobacco use     Past Surgical History:  Procedure Laterality Date   TUBAL LIGATION      Current Medications: Current Meds  Medication Sig   ARNUITY ELLIPTA  100 MCG/ACT AEPB Inhale 1 puff into the lungs daily.   fluticasone  (FLOVENT HFA) 110 MCG/ACT inhaler Inhale 2 puffs into the lungs 2 (two) times daily.   furosemide  (LASIX ) 20 MG tablet Take to 1 tablet (20 mg total) by mouth TWICE daily (total daily dose 40 mg) as needed for up to 3 days for increased leg swelling, shortness of breath, weight gain 5+ lbs over 1-2 days. Seek medical care if these symptoms are not improving with increased dose. (Patient taking differently: Take 20 mg by mouth daily. Take 1 tablet my mouth daily)   hydrOXYzine  (ATARAX ) 50 MG tablet Take 50 mg by mouth at bedtime.   levothyroxine  (SYNTHROID ) 50 MCG tablet Take 50 mcg by mouth daily before breakfast.    lisinopril  (ZESTRIL ) 20 MG tablet Take 0.5 tablets (10 mg total) by mouth daily.   metoprolol  succinate (TOPROL -XL) 50 MG 24 hr tablet Take 25 mg by mouth daily. Take with or immediately following a meal.   potassium chloride  SA (KLOR-CON  M) 20 MEQ tablet Take 1 tablet (20 mEq total) by mouth 2 (two) times daily. On days you take furosemide  (Lasix )   spironolactone (ALDACTONE) 25 MG tablet Take 25 mg by mouth daily.   VENTOLIN  HFA 108 (90 Base) MCG/ACT inhaler Inhale into the lungs.     Allergies:   Patient has no known allergies.   Social History   Socioeconomic History   Marital status: Married    Spouse name: Not on file   Number of children: Not on file   Years of education: Not on file   Highest education level: Not on file  Occupational History   Not on file  Tobacco Use   Smoking status: Every Day   Smokeless tobacco: Never  Substance and Sexual Activity   Alcohol use: Yes   Drug use: No   Sexual activity: Yes  Other Topics Concern   Not on file  Social History Narrative   Not on file   Social Drivers of Health   Financial Resource Strain: Low Risk  (12/01/2022)   Received from [facility]  Overall Financial Resource Strain (CARDIA)    Difficulty of Paying Living Expenses: Not hard at all  Food Insecurity: No Food Insecurity (12/01/2022)   Received from [facility]   Hunger Vital Sign    Within the past 12 months, you worried that your food would run out before you got the money to buy more.: Never true    Within the past 12 months, the food you bought just didn't last and you didn't have money to get more.: Never true  Transportation Needs: No Transportation Needs (12/01/2022)   Received from [facility]   PRAPARE - Transportation    Lack of Transportation (Medical): No    Lack of Transportation (Non-Medical): No  Physical Activity: Not on file  Stress: Not on file  Social Connections: Unknown (08/09/2021)   Received from [facility]   Social  Network    Social Network: Not on file     Family History: The patient's family history includes Hypertension in her mother and sister; Stroke in her mother.  ROS:   Please see the history of present illness.     All other systems reviewed and are negative.  EKGs/Labs/Other Studies Reviewed:    The following studies were reviewed today:       Recent Labs: No results found for requested labs within last 365 days.  Recent Lipid Panel No results found for: CHOL, TRIG, HDL, CHOLHDL, VLDL, LDLCALC, LDLDIRECT   Risk Assessment/Calculations:         Physical Exam:    VS:  BP (!) 110/54 (BP Location: Left Arm, Patient Position: Sitting, Cuff Size: Normal)   Pulse (!) 56   Ht 5' (1.524 m)   Wt 142 lb 3.2 oz (64.5 kg)   SpO2 98%   BMI 27.77 kg/m     Wt Readings from Last 3 Encounters:  12/07/23 142 lb 3.2 oz (64.5 kg)  08/03/23 144 lb 12.8 oz (65.7 kg)  11/13/22 144 lb 2.9 oz (65.4 kg)     GEN:  Well nourished, well developed in no acute distress HEENT: Normal NECK: No JVD; No carotid bruits CARDIAC: RRR, 2/6 systolic murmur RESPIRATORY:  Clear to auscultation without rales, wheezing or rhonchi  ABDOMEN: Soft, non-tender, non-distended MUSCULOSKELETAL:  No edema; No deformity  SKIN: Warm and dry NEUROLOGIC:  Alert and oriented x 3 PSYCHIATRIC:  Normal affect   ASSESSMENT:    1. HFrEF (heart failure with reduced ejection fraction) (HCC)   2. Primary hypertension   3. Moderate aortic regurgitation   4. Current smoker    PLAN:    In order of problems listed above:  HFrEF, initial EF 30 to 35% in 2022, now normalized EF echo 8/25 EF 55-60%.  Appears euvolemic.  Heart rates 56.  Continue Toprol -XL 25 mg daily, Aldactone 25 mg daily,  lisinopril  20 mg daily. Hypertension, BP controlled.  Continue lisinopril  20 mg daily.  Moderate aortic regurgitation, stable from prior, previous moderate mitral regurgitation improved on recent echo to mild.  Echo 8/25  EF 55 to 60%.  Continue serial monitoring of aortic valve echocardiograms. Current smoker, smoking cessation advised.  Follow-up in 12 months     Medication Adjustments/Labs and Tests Ordered: Current medicines are reviewed at length with the patient today.  Concerns regarding medicines are outlined above.  No orders of the defined types were placed in this encounter.  No orders of the defined types were placed in this encounter.   Patient Instructions  Medication Instructions:  Your physician recommends  that you continue on your current medications as directed. Please refer to the Current Medication list given to you today.   *If you need a refill on your cardiac medications before your next appointment, please call your pharmacy*  Lab Work: No labs ordered today  If you have labs (blood work) drawn today and your tests are completely normal, you will receive your results only by: MyChart Message (if you have MyChart) OR A paper copy in the mail If you have any lab test that is abnormal or we need to change your treatment, we will call you to review the results.  Testing/Procedures: No test ordered today   Follow-Up: At [facility], you and your health needs are our priority.  As part of our continuing mission to provide you with exceptional heart care, our providers are all part of one team.  This team includes your primary Cardiologist (physician) and Advanced Practice Providers or APPs (Physician Assistants and Nurse Practitioners) who all work together to provide you with the care you need, when you need it.  Your next appointment:   1 year(s)  Provider:   You may see Dr. Darliss or one of the following Advanced Practice Providers on your designated Care Team:   Lonni Meager, NP Lesley Maffucci, PA-C Bernardino Bring, PA-C Cadence Amesti, PA-C Tylene Lunch, NP Barnie Hila, NP    We recommend signing up for the patient portal called MyChart.  Sign up  information is provided on this After Visit Summary.  MyChart is used to connect with patients for Virtual Visits (Telemedicine).  Patients are able to view lab/test results, encounter notes, upcoming appointments, etc.  Non-urgent messages can be sent to your provider as well.   To learn more about what you can do with MyChart, go to [email or web address].   Other Instructions           Signed, Redell Darliss, MD  12/07/2023 9:53 AM    [facility]

## 2023-12-10 ENCOUNTER — Telehealth: Payer: Self-pay | Admitting: Cardiology

## 2023-12-10 NOTE — Telephone Encounter (Signed)
 Pt's pcp notes were faxed as requested for the patient's past appt on 12/07/23 with Dr. Darliss. Notes were printed and placed in Dr. Bobetta nurse box.

## 2024-04-28 ENCOUNTER — Other Ambulatory Visit: Payer: Self-pay | Admitting: Cardiology

## 2024-04-30 MED ORDER — LISINOPRIL 20 MG PO TABS: 10.0000 mg | ORAL_TABLET | Freq: Every day | ORAL | 2 refills | Status: AC

## 2024-04-30 NOTE — Telephone Encounter (Signed)
 Pt had labs done on 06/20/2023 within 365 day within normal range
